# Patient Record
Sex: Female | Born: 1980 | Race: White | Hispanic: No | Marital: Married | Smoking: Never smoker
Health system: Southern US, Community
[De-identification: ages and names within clinical notes are randomized; demographics above are authoritative.]

## PROBLEM LIST (undated history)

## (undated) DIAGNOSIS — F419 Anxiety disorder, unspecified: Secondary | ICD-10-CM

## (undated) DIAGNOSIS — R519 Headache, unspecified: Secondary | ICD-10-CM

## (undated) DIAGNOSIS — R51 Headache: Secondary | ICD-10-CM

## (undated) DIAGNOSIS — Z86018 Personal history of other benign neoplasm: Principal | ICD-10-CM

## (undated) HISTORY — PX: CHOLECYSTECTOMY: SHX55

## (undated) HISTORY — DX: Personal history of other benign neoplasm: Z86.018

---

## 2009-08-19 ENCOUNTER — Observation Stay: Payer: Self-pay | Admitting: Surgery

## 2013-10-25 DIAGNOSIS — F419 Anxiety disorder, unspecified: Secondary | ICD-10-CM | POA: Insufficient documentation

## 2014-06-02 ENCOUNTER — Ambulatory Visit: Payer: Self-pay

## 2014-10-17 DIAGNOSIS — Z86018 Personal history of other benign neoplasm: Secondary | ICD-10-CM

## 2014-10-17 HISTORY — DX: Personal history of other benign neoplasm: Z86.018

## 2015-10-16 ENCOUNTER — Encounter
Admission: RE | Admit: 2015-10-16 | Discharge: 2015-10-16 | Disposition: A | Discharge status: Home / Self Care | Payer: Managed Care, Other (non HMO) | Source: Ambulatory Visit | Attending: Obstetrics and Gynecology | Admitting: Obstetrics and Gynecology

## 2015-10-16 DIAGNOSIS — Z01812 Encounter for preprocedural laboratory examination: Secondary | ICD-10-CM | POA: Diagnosis not present

## 2015-10-16 HISTORY — DX: Headache, unspecified: R51.9

## 2015-10-16 HISTORY — DX: Anxiety disorder, unspecified: F41.9

## 2015-10-16 HISTORY — DX: Headache: R51

## 2015-10-16 LAB — BASIC METABOLIC PANEL
ANION GAP: 3 — AB (ref 5–15)
BUN: 19 mg/dL (ref 6–20)
CHLORIDE: 104 mmol/L (ref 101–111)
CO2: 29 mmol/L (ref 22–32)
Calcium: 8.8 mg/dL — ABNORMAL LOW (ref 8.9–10.3)
Creatinine, Ser: 0.7 mg/dL (ref 0.44–1.00)
GFR calc non Af Amer: >60 mL/min (ref >60)
Glucose, Bld: 99 mg/dL (ref 65–99)
POTASSIUM: 3.7 mmol/L (ref 3.5–5.1)
SODIUM: 136 mmol/L (ref 135–145)

## 2015-10-16 LAB — CBC
HCT: 39.7 % (ref 35.0–47.0)
HEMOGLOBIN: 13.5 g/dL (ref 12.0–16.0)
MCH: 28.5 pg (ref 26.0–34.0)
MCHC: 34 g/dL (ref 32.0–36.0)
MCV: 83.8 fL (ref 80.0–100.0)
Platelets: 241 10*3/uL (ref 150–440)
RBC: 4.74 MIL/uL (ref 3.80–5.20)
RDW: 13.8 % (ref 11.5–14.5)
WBC: 9.4 10*3/uL (ref 3.6–11.0)

## 2015-10-16 LAB — ABO/RH: ABO/RH(D): O POS

## 2015-10-16 LAB — TYPE AND SCREEN
ABO/RH(D): O POS
Antibody Screen: NEGATIVE

## 2015-10-16 NOTE — H&P (Signed)
  Tracy Cherry is a 35 y.o. female here for Pre Op Consulting . Preop visit for sterilization, outside of the postpartum period.  The patient is interested in permanent sterilization. In her own words, " I have a 35yr old and I am done."  Past Medical History:  has a past medical history of Anxiety, unspecified.  Past Surgical History:  has a past surgical history that includes Cholecystectomy. Family History: family history includes Diabetes mellitus in her paternal grandmother; Lung cancer in her father; No Known Problems in her mother. Social History:  reports that she has never smoked. She does not have any smokeless tobacco history on file. She reports that she does not drink alcohol or use illicit drugs. OB/GYN History:  OB History    Gravida Para Term Preterm AB TAB SAB Ectopic Multiple Living   1 1 1       1       Allergies: has No Known Allergies. Medications:  Current Outpatient Prescriptions:  . desog-e.estradiol/e.estradiol biphasic (KARIVA) 0.15-0.02 mgx21 /0.01 mg x 5 tablet, Take 1 tablet by mouth once daily., Disp: 3 Package, Rfl: 4 . LORazepam (ATIVAN) 1 MG tablet, Take 1 tablet (1 mg total) by mouth once daily as needed for Anxiety. Patient to come back for appt per Mendel Ryder., Disp: 30 tablet, Rfl: 0  Review of Systems: No SOB, no palpitations or chest pain, no new lower extremity edema, no nausea or vomiting or bowel or bladder complaints. See HPI for gyn specific ROS.  Exam:     Visit Vitals  . BP 115/85  . Pulse 92  . Wt 75.2 kg (165 lb 12.8 oz)  . LMP 07/11/2015  . BMI 27.59 kg/m2    General: Patient is well-groomed, well-nourished, appears stated age in no acute distress  HEENT: head is atraumatic and normocephalic, trachea is midline, neck is supple with no palpable nodules  CV: Regular rhythm and normal heart rate, no murmur  Pulm: Clear to auscultation throughout lung fields with no wheezing, crackles, or rhonchi. No increased  work of breathing  Abdomen: soft , no mass, non-tender, no rebound tenderness, no hepatomegaly  Pelvic:  Deferred    Impression:   The primary encounter diagnosis was Preop examination. A diagnosis of Request for sterilization was also pertinent to this visit.    Plan:   Patient desires surgical sterilization. Patient has been counseled on alternate forms of contraception including hormonal forms, IUD's and barrier methods. She has been counseled on risks of surgical sterilization including bleeding, infection, pain, injury during procedure, risk of need for further procedures/surgeries due to injury or abnormalities at the time of surgery, thromboembolic events, exacerbation of ongoing medical conditions, risk of ectopic pregnancy, risk of failure of procedure to prevent pregnancy, medication reactions as well as the risk of anesthesia. Patient verbalizes understanding. Consent form signed. Preoperative and postoperative instructions provided. Written and verbal education provided. No barriers to learning.

## 2015-10-16 NOTE — Patient Instructions (Signed)
  Your procedure is scheduled [id number] April 3 , 2017. Report to Same Day Surgery. To find out your arrival time please call [phone] between 1PM - 3PM on Friday October 20, 2015 .  Remember: Instructions that are not followed completely may result in serious medical risk, up to and including death, or upon the discretion of your surgeon and anesthesiologist your surgery may need to be rescheduled.    _x___ 1. Do not eat food or drink liquids after midnight. No gum chewing or hard candies.     _x___ 2. No Alcohol for 24 hours before or after surgery.   ____ 3. Bring all medications with you on the day of surgery if instructed.    __x__ 4. Notify your doctor if there is any change in your medical condition     (cold, fever, infections).     Do not wear jewelry, make-up, hairpins, clips or nail polish.  Do not wear lotions, powders, or perfumes. You may wear deodorant.  Do not shave 48 hours prior to surgery. Men may shave face and neck.  Do not bring valuables to the hospital.    [facility] is not responsible for any belongings or valuables.               Contacts, dentures or bridgework may not be worn into surgery.  Leave your suitcase in the car. After surgery it may be brought to your room.  For patients admitted to the hospital, discharge time is determined by your treatment team.   Patients discharged the day of surgery will not be allowed to drive home.    Please read over the following fact sheets that you were given:   [facility] Preparing for Surgery  _x___ Take these medicines the morning of surgery with A SIP OF WATER:    1. LORazepam (ATIVAN) optional  ____ Fleet Enema (as directed)   _x___ Use CHG Soap as directed on instruction sheet  ____ Use inhalers on the day of surgery and bring to hospital day of surgery  ____ Stop metformin 2 days prior to surgery    ____ Take 1/2 of usual insulin dose the night before surgery and none on the morning of surgery.    ____ Stop Coumadin/Plavix/aspirin on does not apply.  __x__ Stop Anti-inflammatories such as Advil, Aleve, Ibuprofen, Motrin, Naproxen, Naprosyn, Goodies powders or aspirin products. OK to take Tylenol for pain.   ____ Stop supplements until after surgery.    ____ Bring C-Pap to the hospital.

## 2015-10-22 DIAGNOSIS — F419 Anxiety disorder, unspecified: Secondary | ICD-10-CM | POA: Diagnosis not present

## 2015-10-22 DIAGNOSIS — Z302 Encounter for sterilization: Secondary | ICD-10-CM | POA: Diagnosis present

## 2015-10-22 DIAGNOSIS — N809 Endometriosis, unspecified: Secondary | ICD-10-CM | POA: Diagnosis not present

## 2015-10-23 ENCOUNTER — Ambulatory Visit: Payer: Managed Care, Other (non HMO) | Admitting: Anesthesiology

## 2015-10-23 ENCOUNTER — Encounter: Payer: Self-pay | Admitting: *Deleted

## 2015-10-23 ENCOUNTER — Ambulatory Visit
Admission: RE | Admit: 2015-10-23 | Discharge: 2015-10-23 | Disposition: A | Discharge status: Home / Self Care | Payer: Managed Care, Other (non HMO) | Source: Ambulatory Visit | Attending: Obstetrics and Gynecology | Admitting: Obstetrics and Gynecology

## 2015-10-23 ENCOUNTER — Encounter
Admission: RE | Disposition: A | Discharge status: Home / Self Care | Payer: Self-pay | Source: Ambulatory Visit | Attending: Obstetrics and Gynecology

## 2015-10-23 DIAGNOSIS — Z302 Encounter for sterilization: Secondary | ICD-10-CM | POA: Insufficient documentation

## 2015-10-23 DIAGNOSIS — F419 Anxiety disorder, unspecified: Secondary | ICD-10-CM | POA: Insufficient documentation

## 2015-10-23 DIAGNOSIS — N809 Endometriosis, unspecified: Secondary | ICD-10-CM | POA: Insufficient documentation

## 2015-10-23 HISTORY — PX: LAPAROSCOPIC TUBAL LIGATION: SHX1937

## 2015-10-23 HISTORY — PX: BILATERAL SALPINGECTOMY: SHX5743

## 2015-10-23 LAB — POCT PREGNANCY, URINE: PREG TEST UR: NEGATIVE

## 2015-10-23 SURGERY — LIGATION, FALLOPIAN TUBE, LAPAROSCOPIC
Anesthesia: General | Site: Abdomen | Laterality: Bilateral | Wound class: Clean

## 2015-10-23 MED ORDER — ACETAMINOPHEN 10 MG/ML IV SOLN
INTRAVENOUS | Status: AC
  Filled 2015-10-23: qty 100

## 2015-10-23 MED ORDER — FENTANYL CITRATE (PF) 100 MCG/2ML IJ SOLN
25.0000 ug | INTRAMUSCULAR | Status: DC | PRN
  Administered 2015-10-23: 25 ug via INTRAVENOUS
  Administered 2015-10-23: 50 ug via INTRAVENOUS

## 2015-10-23 MED ORDER — LIDOCAINE HCL (CARDIAC) 20 MG/ML IV SOLN
INTRAVENOUS | Status: DC | PRN
  Administered 2015-10-23: 100 mg via INTRAVENOUS

## 2015-10-23 MED ORDER — BUPIVACAINE HCL 0.5 % IJ SOLN
INTRAMUSCULAR | Status: DC | PRN
Start: 2015-10-23 — End: 2015-10-23
  Administered 2015-10-23: 14 mL

## 2015-10-23 MED ORDER — FENTANYL CITRATE (PF) 100 MCG/2ML IJ SOLN
INTRAMUSCULAR | Status: AC
  Filled 2015-10-23: qty 2

## 2015-10-23 MED ORDER — BUPIVACAINE HCL (PF) 0.5 % IJ SOLN
INTRAMUSCULAR | Status: AC
  Filled 2015-10-23: qty 30

## 2015-10-23 MED ORDER — ACETAMINOPHEN 10 MG/ML IV SOLN
INTRAVENOUS | Status: DC | PRN
  Administered 2015-10-23: 1000 mg via INTRAVENOUS

## 2015-10-23 MED ORDER — FAMOTIDINE 20 MG PO TABS
20.0000 mg | ORAL_TABLET | Freq: Once | ORAL | Status: AC
  Administered 2015-10-23: 20 mg via ORAL

## 2015-10-23 MED ORDER — PROPOFOL 10 MG/ML IV BOLUS
INTRAVENOUS | Status: DC | PRN
  Administered 2015-10-23: 150 mg via INTRAVENOUS

## 2015-10-23 MED ORDER — KETOROLAC TROMETHAMINE 30 MG/ML IJ SOLN
INTRAMUSCULAR | Status: DC | PRN
  Administered 2015-10-23: 30 mg via INTRAVENOUS

## 2015-10-23 MED ORDER — FENTANYL CITRATE (PF) 100 MCG/2ML IJ SOLN
INTRAMUSCULAR | Status: DC | PRN
  Administered 2015-10-23 (×2): 50 ug via INTRAVENOUS

## 2015-10-23 MED ORDER — DEXAMETHASONE SODIUM PHOSPHATE 10 MG/ML IJ SOLN
INTRAMUSCULAR | Status: DC | PRN
  Administered 2015-10-23: 5 mg via INTRAVENOUS

## 2015-10-23 MED ORDER — ONDANSETRON HCL 4 MG/2ML IJ SOLN
INTRAMUSCULAR | Status: DC | PRN
  Administered 2015-10-23: 4 mg via INTRAVENOUS

## 2015-10-23 MED ORDER — IBUPROFEN 800 MG PO TABS: 800.0000 mg | ORAL_TABLET | Freq: Three times a day (TID) | ORAL | Status: AC | PRN

## 2015-10-23 MED ORDER — OXYCODONE HCL 5 MG PO TABS: 5.0000 mg | ORAL_TABLET | Freq: Once | ORAL | Status: DC | PRN

## 2015-10-23 MED ORDER — ROCURONIUM BROMIDE 100 MG/10ML IV SOLN
INTRAVENOUS | Status: DC | PRN
  Administered 2015-10-23: 30 mg via INTRAVENOUS
  Administered 2015-10-23: 10 mg via INTRAVENOUS

## 2015-10-23 MED ORDER — FAMOTIDINE 20 MG PO TABS
ORAL_TABLET | ORAL | Status: AC
  Filled 2015-10-23: qty 1

## 2015-10-23 MED ORDER — MIDAZOLAM HCL 2 MG/2ML IJ SOLN
INTRAMUSCULAR | Status: DC | PRN
  Administered 2015-10-23: 2 mg via INTRAVENOUS

## 2015-10-23 MED ORDER — SUGAMMADEX SODIUM 200 MG/2ML IV SOLN
INTRAVENOUS | Status: DC | PRN
  Administered 2015-10-23: 149.6 mg via INTRAVENOUS

## 2015-10-23 MED ORDER — LACTATED RINGERS IV SOLN
INTRAVENOUS | Status: DC
  Administered 2015-10-23 (×2): via INTRAVENOUS

## 2015-10-23 MED ORDER — OXYCODONE HCL 5 MG/5ML PO SOLN: 5.0000 mg | Freq: Once | ORAL | Status: DC | PRN

## 2015-10-23 MED ORDER — OXYCODONE-ACETAMINOPHEN 5-325 MG PO TABS
1.0000 | ORAL_TABLET | Freq: Four times a day (QID) | ORAL | Status: DC | PRN
Start: 2015-10-23 — End: 2021-08-15

## 2015-10-23 SURGICAL SUPPLY — 27 items
BLADE SURG SZ11 CARB STEEL (BLADE) ×3 IMPLANT
CATH ROBINSON RED A/P 16FR (CATHETERS) ×3 IMPLANT
CHLORAPREP W/TINT 26ML (MISCELLANEOUS) ×3 IMPLANT
CLOSURE WOUND 1/4X4 (GAUZE/BANDAGES/DRESSINGS) ×2
DRAPE UTILITY 15X26 TOWEL STRL (DRAPES) ×6 IMPLANT
GLOVE BIO SURGEON STRL SZ 6.5 (GLOVE) ×2 IMPLANT
GLOVE BIO SURGEONS STRL SZ 6.5 (GLOVE) ×1
GLOVE INDICATOR 7.0 STRL GRN (GLOVE) ×3 IMPLANT
GOWN STRL REUS W/ TWL LRG LVL3 (GOWN DISPOSABLE) ×2 IMPLANT
GOWN STRL REUS W/TWL LRG LVL3 (GOWN DISPOSABLE) ×4
KIT RM TURNOVER CYSTO AR (KITS) ×3 IMPLANT
LABEL OR SOLS (LABEL) ×3 IMPLANT
LIGASURE BLUNT 5MM 37CM (INSTRUMENTS) ×3 IMPLANT
LIQUID BAND (GAUZE/BANDAGES/DRESSINGS) ×3 IMPLANT
NS IRRIG 500ML POUR BTL (IV SOLUTION) ×3 IMPLANT
PACK GYN LAPAROSCOPIC (MISCELLANEOUS) ×3 IMPLANT
PAD OB MATERNITY 4.3X12.25 (PERSONAL CARE ITEMS) ×3 IMPLANT
PAD PREP 24X41 OB/GYN DISP (PERSONAL CARE ITEMS) ×3 IMPLANT
SLEEVE ENDOPATH XCEL 5M (ENDOMECHANICALS) ×9 IMPLANT
STRIP CLOSURE SKIN 1/4X4 (GAUZE/BANDAGES/DRESSINGS) ×4 IMPLANT
SUT MNCRL 4-0 (SUTURE) ×2
SUT MNCRL 4-0 27XMFL (SUTURE) ×1
SUT VIC AB 2-0 UR6 27 (SUTURE) ×3 IMPLANT
SUTURE MNCRL 4-0 27XMF (SUTURE) ×1 IMPLANT
TROCAR ENDO BLADELESS 11MM (ENDOMECHANICALS) ×3 IMPLANT
TROCAR XCEL NON-BLD 5MMX100MML (ENDOMECHANICALS) ×3 IMPLANT
TUBING INSUFFLATOR HI FLOW (MISCELLANEOUS) ×3 IMPLANT

## 2015-10-23 NOTE — Transfer of Care (Signed)
Immediate Anesthesia Transfer of Care Note  Patient: Tracy Cherry  Procedure(s) Performed: Procedure(s): LAPAROSCOPIC TUBAL LIGATION WITH PERIOTNEAL BIOPSY (Bilateral) BILATERAL SALPINGECTOMY (Bilateral)  Patient Location: PACU  Anesthesia Type:General  Level of Consciousness: awake, alert  and oriented  Airway & Oxygen Therapy: Patient Spontanous Breathing and Patient connected to face mask oxygen  Post-op Assessment: Report given to RN and Post -op Vital signs reviewed and stable  Post vital signs: stable  Last Vitals:  Filed Vitals:   10/23/15 0612 10/23/15 0902  BP: 130/65 115/63  Pulse: 85 91  Temp: 36.7 C 36.6 C  Resp: 16 13    Complications: No apparent anesthesia complications

## 2015-10-23 NOTE — Interval H&P Note (Signed)
History and Physical Interval Note:  10/23/2015 7:27 AM  Tracy Cherry  has presented today for surgery, with the diagnosis of desires sterilization  The various methods of treatment have been discussed with the patient and family. After consideration of risks, benefits and other options for treatment, the patient has consented to  Procedure(s): LAPAROSCOPIC TUBAL LIGATION (Bilateral) by salpingectomy as a surgical intervention .  The patient's history has been reviewed, patient examined, no change in status, stable for surgery.  I have reviewed the patient's chart and labs.  Questions were answered to the patient's satisfaction.     Benjaman Kindler

## 2015-10-23 NOTE — Anesthesia Postprocedure Evaluation (Signed)
Anesthesia Post Note  Patient: Tracy Cherry  Procedure(s) Performed: Procedure(s) (LRB): LAPAROSCOPIC TUBAL LIGATION WITH PERIOTNEAL BIOPSY (Bilateral) BILATERAL SALPINGECTOMY (Bilateral)  Patient location during evaluation: PACU Anesthesia Type: General Level of consciousness: awake and alert Pain management: pain level controlled Vital Signs Assessment: post-procedure vital signs reviewed and stable Respiratory status: spontaneous breathing, nonlabored ventilation, respiratory function stable and patient connected to nasal cannula oxygen Cardiovascular status: blood pressure returned to baseline and stable Postop Assessment: no signs of nausea or vomiting Anesthetic complications: no    Last Vitals:  Filed Vitals:   10/23/15 0612 10/23/15 0902  BP: 130/65 115/63  Pulse: 85 91  Temp: 36.7 C 36.6 C  Resp: 16 13    Last Pain:  Filed Vitals:   10/23/15 0945  PainSc: 1                  Precious Haws Seth Higginbotham

## 2015-10-23 NOTE — Anesthesia Preprocedure Evaluation (Signed)
Anesthesia Evaluation  Patient identified by MRN, date of birth, ID band Patient awake    Reviewed: Allergy & Precautions, H&P , NPO status , Patient's Chart, lab work & pertinent test results  History of Anesthesia Complications Negative for: history of anesthetic complications  Airway Mallampati: II  TM Distance: >3 FB Neck ROM: full    Dental no notable dental hx.    Pulmonary neg pulmonary ROS, neg shortness of breath,    Pulmonary exam normal breath sounds clear to auscultation       Cardiovascular Exercise Tolerance: Good (-) angina(-) Past MI and (-) DOE negative cardio ROS Normal cardiovascular exam Rhythm:regular Rate:Normal     Neuro/Psych  Headaches, PSYCHIATRIC DISORDERS Anxiety    GI/Hepatic negative GI ROS, Neg liver ROS, neg GERD  ,  Endo/Other  negative endocrine ROS  Renal/GU negative Renal ROS  negative genitourinary   Musculoskeletal   Abdominal   Peds  Hematology negative hematology ROS (+)   Anesthesia Other Findings Past Medical History:   Headache                                                     Anxiety                                                     Past Surgical History:   CHOLECYSTECTOMY                                                 Reproductive/Obstetrics negative OB ROS                             Anesthesia Physical Anesthesia Plan  ASA: III  Anesthesia Plan: General ETT   Post-op Pain Management:    Induction:   Airway Management Planned:   Additional Equipment:   Intra-op Plan:   Post-operative Plan:   Informed Consent: I have reviewed the patients History and Physical, chart, labs and discussed the procedure including the risks, benefits and alternatives for the proposed anesthesia with the patient or authorized representative who has indicated his/her understanding and acceptance.   Dental Advisory Given  Plan Discussed  with: Anesthesiologist, CRNA and Surgeon  Anesthesia Plan Comments:         Anesthesia Quick Evaluation

## 2015-10-23 NOTE — Discharge Instructions (Signed)
Laparoscopic Tubal Ligation Discharge Instructions  Laparoscopic tubal ligation and fulguration ties your fallopian tubes to prevent pregnancy in the future  RISKS AND COMPLICATIONS   Infection.  Bleeding.  Injury to surrounding organs.  Anesthetic side effects.  Failure of the procedure.  Risks of future ectopic pregnancy  PROCEDURE   You may be given a medicine to help you relax (sedative) before the procedure. You will be given a medicine to make you sleep (general anesthetic) during the procedure.  A tube will be put down your throat to help your breath while under general anesthesia.  Two small cuts (incisions) are made in the lower abdominal area and one incision is made near the belly button.  Your abdominal area will be inflated with a safe gas (carbon dioxide). This helps give the surgeon room to operate, visualize, and helps the surgeon avoid other organs.  A thin, lighted tube (laparoscope) with a camera attached is inserted into your abdomen through the incision near the belly button. Other small instruments may also be inserted through other abdominal incisions.  The fallopian tube is located and are removed.  After the fallopian tube is removed, the gas is released from the abdomen.  The incisions will be closed with stitches (sutures), and Dermabond. A bandage may be placed over the incisions.  AFTER THE PROCEDURE   You will also have some mild abdominal discomfort for 3-7 days. You will be given pain medicine to ease any discomfort.  As long as there are no problems, you may be allowed to go home. Someone will need to drive you home and be with you for at least 24 hours once home.  You may have some mild discomfort in the throat. This is from the tube placed in your throat while you were sleeping.  You may experience discomfort in the shoulder area from some trapped air between the liver and diaphragm. This sensation is normal and will slowly go away on its  own.  HOME CARE INSTRUCTIONS   Take all medicines as directed.  Only take over-the-counter or prescription medicines for pain, discomfort, or fever as directed by your caregiver.  Resume daily activities as directed.  Showers are preferred over baths.  You may resume sexual activities in 1 week or as directed.  Do not drive while taking narcotics.  SEEK MEDICAL CARE IF: .  There is increasing abdominal pain.  You feel lightheaded or faint.  You have the chills.  You have an oral temperature above 102 F (38.9 C).  There is pus-like (purulent) drainage from any of the wounds.  You are unable to pass gas or have a bowel movement.  You feel sick to your stomach (nauseous) or throw up (vomit).  MAKE SURE YOU:   Understand these instructions.  Will watch your condition.  Will get help right away if you are not doing well or get worse.  ExitCare Patient Information 2013 [place].    General Anesthesia, Adult General anesthesia is a sleep-like state of non-feeling produced by medicines (anesthetics). General anesthesia prevents you from being alert and feeling pain during a medical procedure. Your caregiver may recommend general anesthesia if your procedure: Is long. Is painful or uncomfortable. Would be frightening to see or hear. Requires you to be still. Affects your breathing. Causes significant blood loss. LET YOUR CAREGIVER KNOW ABOUT: Allergies to food or medicine. Medicines taken, including vitamins, herbs, eyedrops, over-the-counter medicines, and creams. Use of steroids (by mouth or creams). Previous problems with anesthetics  or numbing medicines, including problems experienced by relatives. History of bleeding problems or blood clots. Previous surgeries and types of anesthetics received. Possibility of pregnancy, if this applies. Use of cigarettes, alcohol, or illegal drugs. Any health condition(s), especially diabetes, sleep apnea, and high  blood pressure. RISKS AND COMPLICATIONS General anesthesia rarely causes complications. However, if complications do occur, they can be life threatening. Complications include: A lung infection. A stroke. A heart attack. Waking up during the procedure. When this occurs, the patient may be unable to move and communicate that he or she is awake. The patient may feel severe pain. Older adults and adults with serious medical problems are more likely to have complications than adults who are young and healthy. Some complications can be prevented by answering all of your caregiver's questions thoroughly and by following all pre-procedure instructions. It is important to tell your caregiver if any of the pre-procedure instructions, especially those related to diet, were not followed. Any food or liquid in the stomach can cause problems when you are under general anesthesia. BEFORE THE PROCEDURE Ask your caregiver if you will have to spend the night at the hospital. If you will not have to spend the night, arrange to have an adult drive you and stay with you for 24 hours. Follow your caregiver's instructions if you are taking dietary supplements or medicines. Your caregiver may tell you to stop taking them or to reduce your dosage. Do not smoke for as long as possible before your procedure. If possible, stop smoking 3-6 weeks before the procedure. Do not take new dietary supplements or medicines within 1 week of your procedure unless your caregiver approves them. Do not eat within 8 hours of your procedure or as directed by your caregiver. Drink only clear liquids, such as water, black coffee (without milk or cream), and fruit juices (without pulp). Do not drink within 3 hours of your procedure or as directed by your caregiver. You may brush your teeth on the morning of the procedure, but make sure to spit out the toothpaste and water when finished. PROCEDURE  You will receive anesthetics through a mask,  through an intravenous (IV) access tube, or through both. A doctor who specializes in anesthesia (anesthesiologist) or a nurse who specializes in anesthesia (nurse anesthetist) or both will stay with you throughout the procedure to make sure you remain unconscious. He or she will also watch your blood pressure, pulse, and oxygen levels to make sure that the anesthetics do not cause any problems. Once you are asleep, a breathing tube or mask may be used to help you breathe. AFTER THE PROCEDURE You will wake up after the procedure is complete. You may be in the room where the procedure was performed or in a recovery area. You may have a sore throat if a breathing tube was used. You may also feel: Dizzy. Weak. Drowsy. Confused. Nauseous. Cold. These are all normal responses and can be expected to last for up to 24 hours after the procedure is complete. A caregiver will tell you when you are ready to go home. This will usually be when you are fully awake and in stable condition.   This information is not intended to replace advice given to you by your health care provider. Make sure you discuss any questions you have with your health care provider.   Document Released: 10/15/2007 Document Revised: 07/29/2014 Document Reviewed: 11/06/2011 Elsevier Interactive Patient Education [employer].

## 2015-10-23 NOTE — Anesthesia Procedure Notes (Signed)
Procedure Name: Intubation Date/Time: 10/23/2015 7:42 AM Performed by: Aline Brochure Pre-anesthesia Checklist: Patient identified, Emergency Drugs available, Suction available and Patient being monitored Patient Re-evaluated:Patient Re-evaluated prior to inductionOxygen Delivery Method: Circle system utilized Preoxygenation: Pre-oxygenation with 100% oxygen Intubation Type: IV induction Ventilation: Mask ventilation without difficulty Laryngoscope Size: Mac and 3 Grade View: Grade I Tube type: Oral Tube size: 7.0 mm Number of attempts: 1 Airway Equipment and Method: Stylet Placement Confirmation: ETT inserted through vocal cords under direct vision,  positive ETCO2 and breath sounds checked- equal and bilateral Secured at: 21 cm Tube secured with: Tape Dental Injury: Teeth and Oropharynx as per pre-operative assessment

## 2015-10-23 NOTE — Op Note (Signed)
Tracy Cherry 10/23/2015  PREOPERATIVE DIAGNOSIS:  Undesired fertility  POSTOPERATIVE DIAGNOSIS:  Undesired fertility  PROCEDURE:  Laparoscopic Bilateral Tubal Sterilization using Bipolar Coagulation; peritoneal biopsy  ANESTHESIA:  General endotracheal  ANESTHESIOLOGIST: Andria Frames, MD Anesthesiologist: Andria Frames, MD CRNA: Aline Brochure, CRNA  SURGEON: Angelina Pih, MD  COMPLICATIONS:  None immediate.  ESTIMATED BLOOD LOSS:  Less than 20 ml.  FLUIDS: 1100 ml LR.  URINE OUTPUT:  300 ml of clear urine.  INDICATIONS: 35 y.o.   with undesired fertility, desires permanent sterilization. Other reversible forms of contraception were discussed with patient; she declines all other modalities.  Risks of procedure discussed with patient including permanence of method, bleeding, infection, injury to surrounding organs and need for additional procedures including laparotomy, risk of regret.  Failure risk of 0.5-1% with increased risk of ectopic gestation if pregnancy occurs was also discussed with patient.      FINDINGS:  Normal uterus, tubes, and ovaries. Powder burn implant on right pelvic sidewall cephalad to uterosacral ligaments. Omental adhesions to right anterior pelvis superior to the bladder that was left alone.  TECHNIQUE:  The patient was taken to the operating room where general anesthesia was obtained without difficulty.  She was then placed in the dorsal lithotomy position and prepared and draped in sterile fashion.  The bladder was cathed for an estimated amount of clear urine. After an adequate timeout was performed, a bivalved speculum was then placed in the patient's vagina, and the anterior lip of cervix grasped with the single-tooth tenaculum.  The uterine manipulator was then advanced into the uterus.  The speculum was removed from the vagina.   Attention was then turned to the patient's abdomen where a 5-mm skin incision was made in the umbilical  fold.  The Optiview51-mm trocar and sleeve were then advanced without difficulty with the laparoscope under direct visualization into the abdomen.  The abdomen was then insufflated with carbon dioxide gas and adequate pneumoperitoneum was obtained.  A survey of the patient's pelvis and abdomen revealed normal anatomy except for the implant, which was carefully sharply excised, being aware of the course of the ureter.     The fallopian tubes were observed and found to be normal in appearance. The courses of both ureters were visualized. A 23mm port was placed in the RLQ under direct visualization. A Ligasure device was then advanced through the operative port and used to coagulate and excise the full Fallopian tube, including the fimbriated ends.  Good blanching and coagulation was noted at the site of the application.  There was no bleeding noted in the mesosalpinx.  A similar process was carried out on the right fallopian tube allowing for bilateral tubal sterilization.   Good hemostasis was noted overall.  The instruments were then removed from the patient's abdomen and the fascial incision was repaired with 0 Vicryl, and the skin was closed with Dermabond.  The uterine manipulator and the tenaculum were removed from the vagina without complications. The patient tolerated the procedure well.  Sponge, lap, and needle counts were correct times two.  The patient was then taken to the recovery room awake, extubated and in stable condition.

## 2015-10-24 LAB — SURGICAL PATHOLOGY

## 2018-10-28 DIAGNOSIS — Z86018 Personal history of other benign neoplasm: Secondary | ICD-10-CM

## 2019-03-15 ENCOUNTER — Other Ambulatory Visit: Payer: Self-pay | Admitting: Student

## 2019-03-15 ENCOUNTER — Other Ambulatory Visit (HOSPITAL_COMMUNITY): Payer: Self-pay | Admitting: Student

## 2019-03-15 DIAGNOSIS — R1012 Left upper quadrant pain: Secondary | ICD-10-CM

## 2019-03-22 ENCOUNTER — Ambulatory Visit
Admission: RE | Admit: 2019-03-22 | Discharge: 2019-03-22 | Disposition: A | Discharge status: Home / Self Care | Payer: Managed Care, Other (non HMO) | Source: Ambulatory Visit | Attending: Student | Admitting: Student

## 2019-03-22 ENCOUNTER — Other Ambulatory Visit: Payer: Self-pay

## 2019-03-22 DIAGNOSIS — R1012 Left upper quadrant pain: Secondary | ICD-10-CM | POA: Diagnosis not present

## 2019-06-22 ENCOUNTER — Other Ambulatory Visit: Payer: Self-pay | Admitting: Obstetrics and Gynecology

## 2019-06-22 DIAGNOSIS — Z1231 Encounter for screening mammogram for malignant neoplasm of breast: Secondary | ICD-10-CM

## 2019-06-24 ENCOUNTER — Ambulatory Visit
Admission: RE | Admit: 2019-06-24 | Discharge: 2019-06-24 | Disposition: A | Discharge status: Home / Self Care | Payer: Managed Care, Other (non HMO) | Source: Ambulatory Visit | Attending: Obstetrics and Gynecology | Admitting: Obstetrics and Gynecology

## 2019-06-24 DIAGNOSIS — Z1231 Encounter for screening mammogram for malignant neoplasm of breast: Secondary | ICD-10-CM | POA: Diagnosis present

## 2020-07-06 ENCOUNTER — Other Ambulatory Visit: Payer: Self-pay | Admitting: Obstetrics and Gynecology

## 2020-07-06 DIAGNOSIS — Z1231 Encounter for screening mammogram for malignant neoplasm of breast: Secondary | ICD-10-CM

## 2020-08-15 ENCOUNTER — Ambulatory Visit
Admission: RE | Admit: 2020-08-15 | Discharge: 2020-08-15 | Disposition: A | Discharge status: Home / Self Care | Payer: Managed Care, Other (non HMO) | Source: Ambulatory Visit | Attending: Obstetrics and Gynecology | Admitting: Obstetrics and Gynecology

## 2020-08-15 ENCOUNTER — Other Ambulatory Visit: Payer: Self-pay

## 2020-08-15 DIAGNOSIS — Z1231 Encounter for screening mammogram for malignant neoplasm of breast: Secondary | ICD-10-CM | POA: Diagnosis present

## 2020-12-15 IMAGING — MG DIGITAL SCREENING BILAT W/ TOMO W/ CAD
8 series · 8 of 24 positions shown · non-contrast
Comparison: Previous exam(s).

CLINICAL DATA: Screening.

EXAM:
DIGITAL SCREENING BILATERAL MAMMOGRAM WITH TOMO AND CAD

[R CC synth-2D]
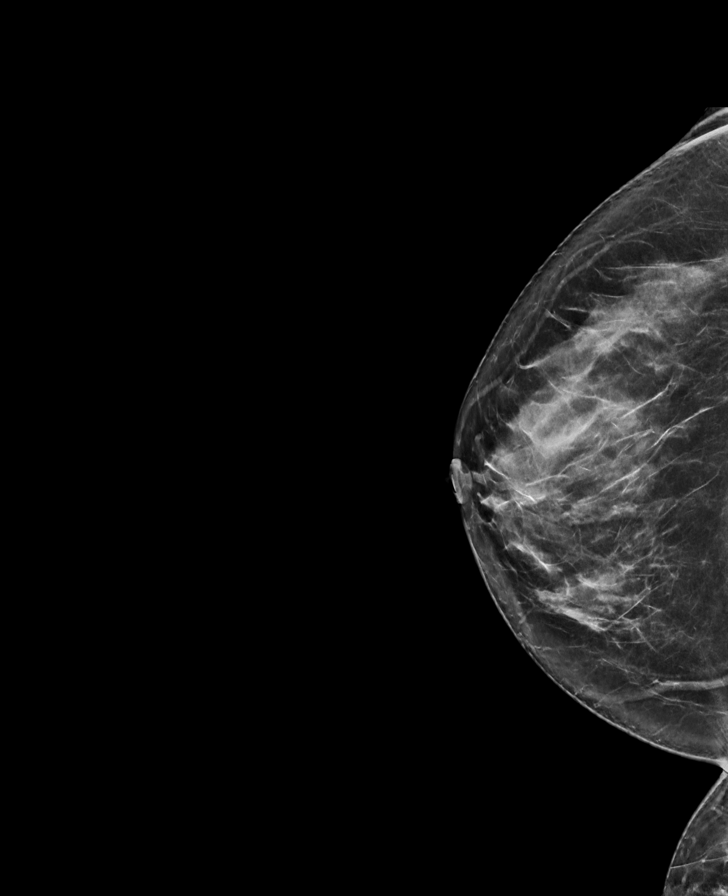

[L MLO synth-2D]
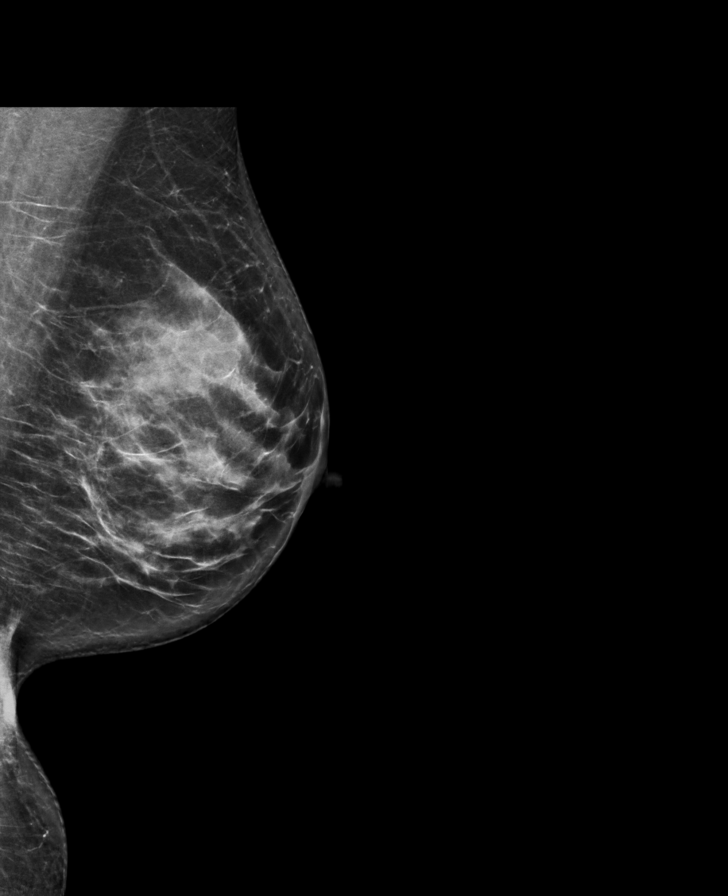

[L CC synth-2D]
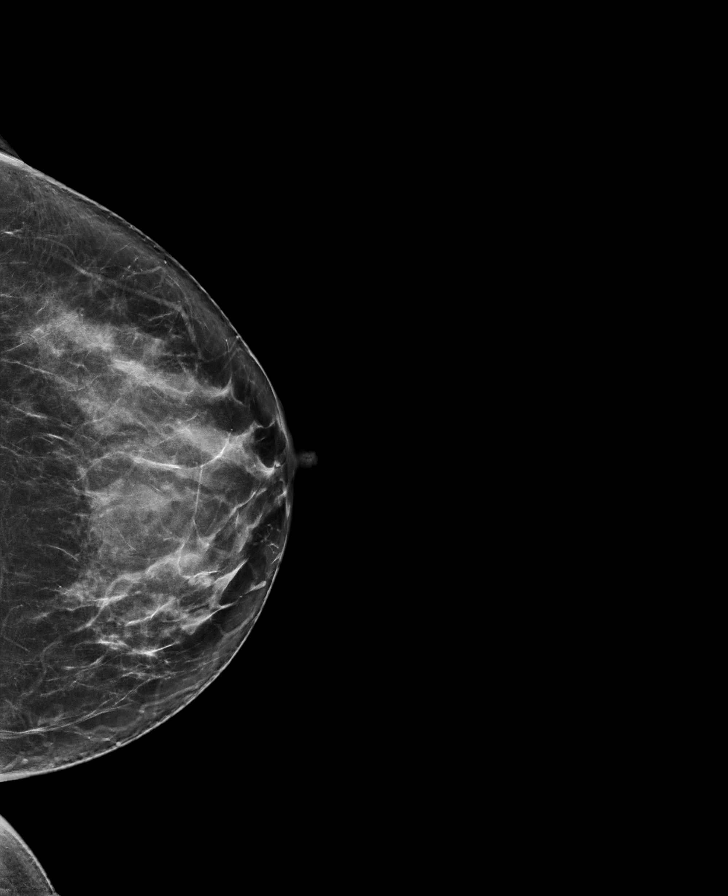

[R MLO synth-2D]
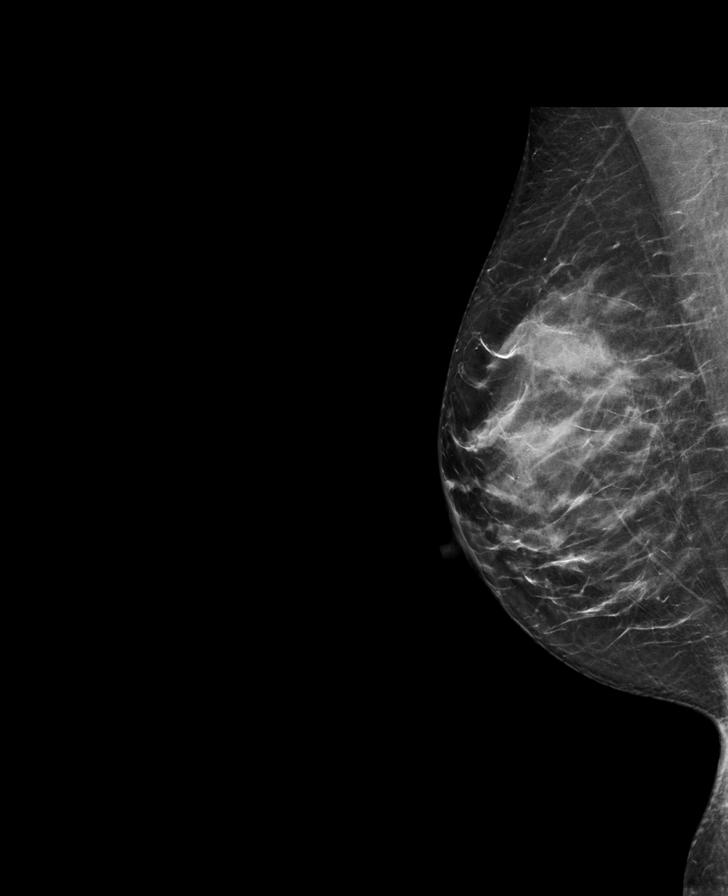

[L MLO tomo · tomo slice 39/78.0]
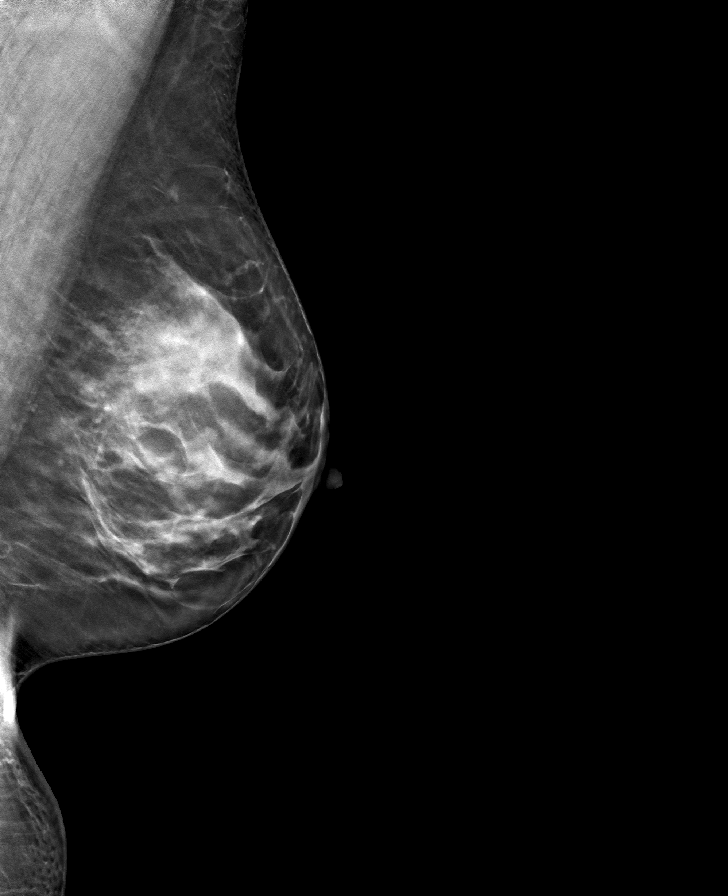

[R CC tomo · tomo slice 37/72.0]
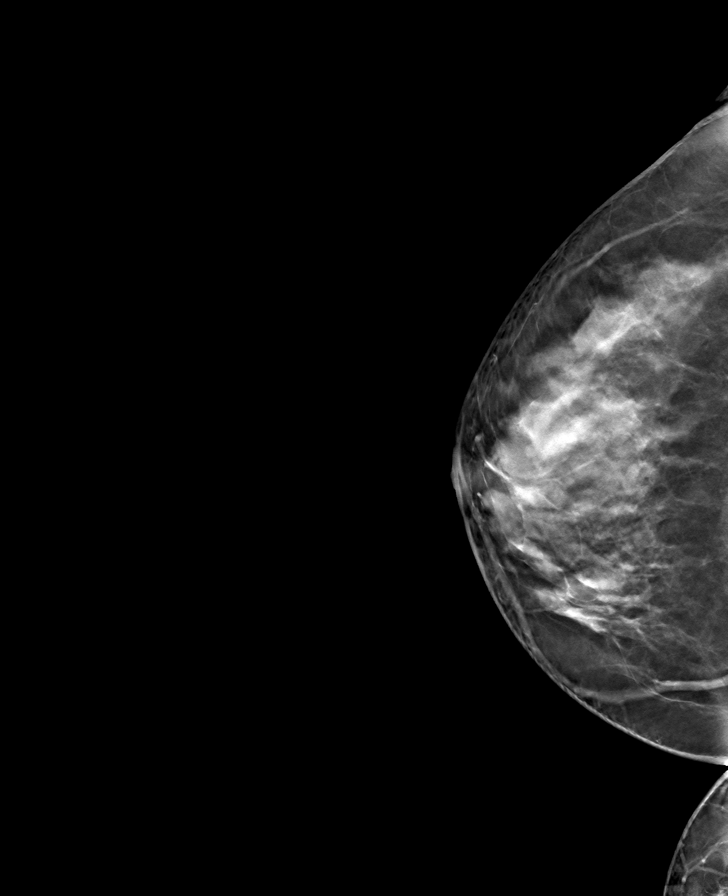

[R MLO tomo · tomo slice 37/74.0]
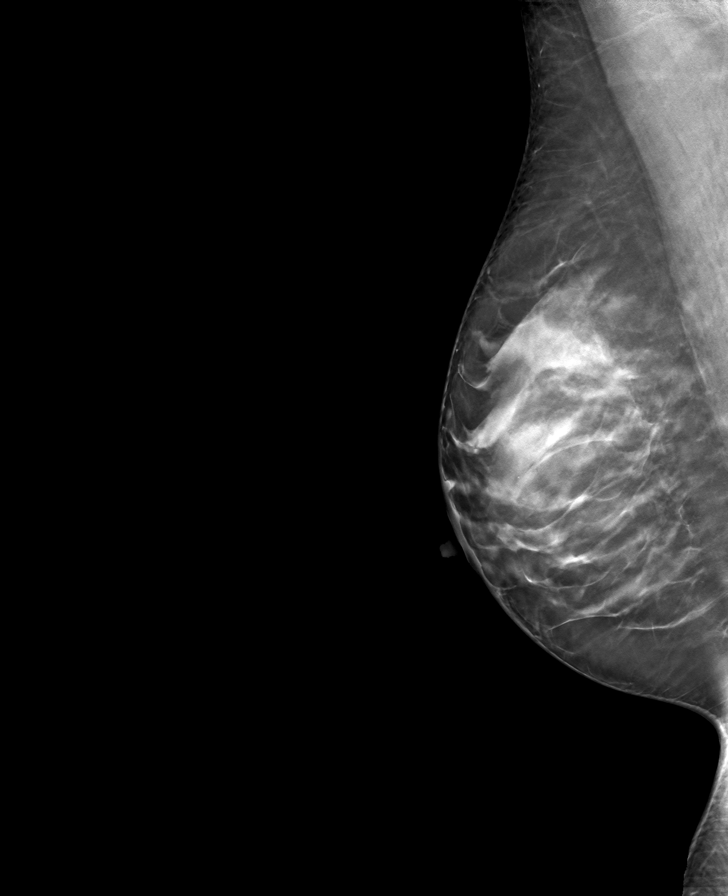

[L CC tomo · tomo slice 35/70.0]
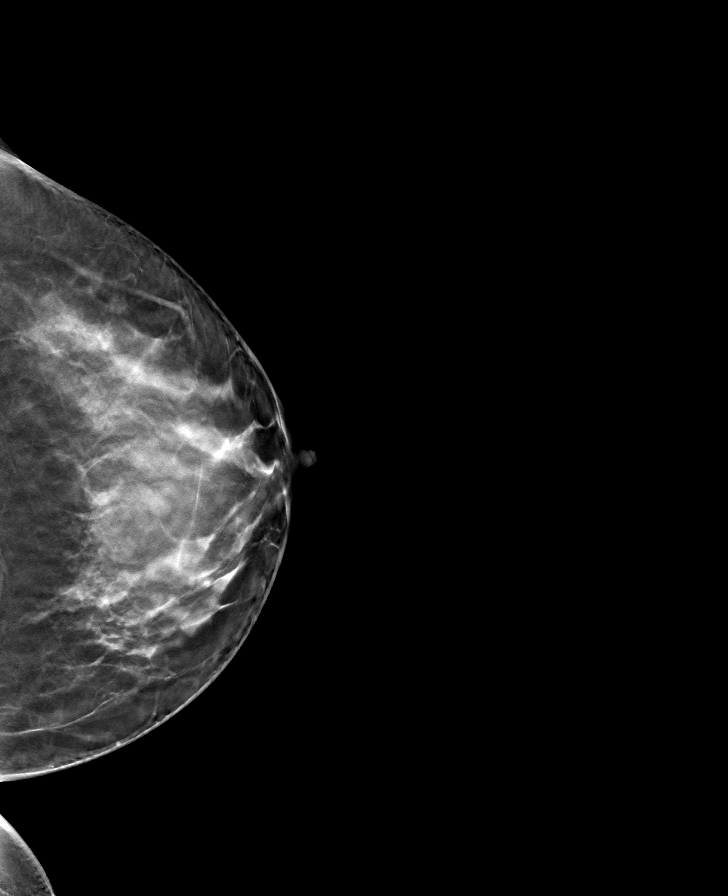

[8 of 24 positions shown; findings below may reference images not displayed]

ACR Breast Density Category c: The breast tissue is heterogeneously
dense, which may obscure small masses.
FINDINGS: There are no findings suspicious for malignancy. Images were
processed with CAD.
IMPRESSION: No mammographic evidence of malignancy. A result letter of this
screening mammogram will be mailed directly to the patient.

RECOMMENDATION:
Screening mammogram in one year. (Code:FT-U-LHB)

BI-RADS CATEGORY  1: Negative.

## 2021-07-10 ENCOUNTER — Other Ambulatory Visit: Payer: Self-pay | Admitting: Obstetrics and Gynecology

## 2021-07-12 ENCOUNTER — Other Ambulatory Visit: Payer: Self-pay | Admitting: Obstetrics and Gynecology

## 2021-07-12 DIAGNOSIS — N63 Unspecified lump in unspecified breast: Secondary | ICD-10-CM

## 2021-07-20 ENCOUNTER — Ambulatory Visit
Admission: RE | Admit: 2021-07-20 | Discharge: 2021-07-20 | Disposition: A | Discharge status: Home / Self Care | Payer: Managed Care, Other (non HMO) | Source: Ambulatory Visit | Attending: Obstetrics and Gynecology | Admitting: Obstetrics and Gynecology

## 2021-07-20 ENCOUNTER — Other Ambulatory Visit: Payer: Self-pay

## 2021-07-20 DIAGNOSIS — N63 Unspecified lump in unspecified breast: Secondary | ICD-10-CM

## 2021-08-15 ENCOUNTER — Ambulatory Visit: Payer: Managed Care, Other (non HMO) | Admitting: Dermatology

## 2021-08-15 ENCOUNTER — Encounter: Payer: Self-pay | Admitting: Dermatology

## 2021-08-15 ENCOUNTER — Other Ambulatory Visit: Payer: Self-pay

## 2021-08-15 DIAGNOSIS — L603 Nail dystrophy: Secondary | ICD-10-CM | POA: Diagnosis not present

## 2021-08-15 DIAGNOSIS — L814 Other melanin hyperpigmentation: Secondary | ICD-10-CM

## 2021-08-15 DIAGNOSIS — L821 Other seborrheic keratosis: Secondary | ICD-10-CM

## 2021-08-15 DIAGNOSIS — L578 Other skin changes due to chronic exposure to nonionizing radiation: Secondary | ICD-10-CM

## 2021-08-15 DIAGNOSIS — L720 Epidermal cyst: Secondary | ICD-10-CM

## 2021-08-15 DIAGNOSIS — Z1283 Encounter for screening for malignant neoplasm of skin: Secondary | ICD-10-CM

## 2021-08-15 DIAGNOSIS — D1800 Hemangioma unspecified site: Secondary | ICD-10-CM

## 2021-08-15 DIAGNOSIS — D229 Melanocytic nevi, unspecified: Secondary | ICD-10-CM

## 2021-08-15 NOTE — Progress Notes (Addendum)
New Patient Visit   Subjective  Tracy Cherry is a 41 y.o. female who presents for the following: Follow-up (Patient here today for 1 year tbse. Patient has a sebaceous cyst between breast. Patient denies any family or personal history of skin cancer. ).  Patient here for full body skin exam and skin cancer screening.  The following portions of the chart were reviewed this encounter and updated as appropriate:  Tobacco   Allergies   Meds   Problems   Med Hx   Surg Hx   Fam Hx       Review of Systems: No other skin or systemic complaints except as noted in HPI or Assessment and Plan.   Objective  Well appearing patient in no apparent distress; mood and affect are within normal limits.  A full examination was performed including scalp, head, eyes, ears, nose, lips, neck, chest, axillae, abdomen, back, buttocks, bilateral upper extremities, bilateral lower extremities, hands, feet, fingers, toes, fingernails, and toenails. All findings within normal limits unless otherwise noted below.  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 Pt defers at this time.   Patient using tea tree oil with some improvement. Will continue for now and let us know if she desires clipping to consider other therapy in future.      Epidermal inclusion cyst Chest - Medial (Center)  Benign-appearing. Exam most consistent with an epidermal inclusion cyst. Discussed that a cyst is a benign growth that can grow over time and sometimes get irritated or inflamed. Recommend  observation if it is not bothersome. Discussed option of surgical excision to remove it if it is growing, symptomatic, or other changes noted. Please call for new or changing lesions so they can be evaluated.    Lentigines - Scattered tan macules - Due to sun exposure - Benign-appearing, observe - Recommend daily broad spectrum sunscreen SPF 30+ to sun-exposed areas, reapply every 2 hours as needed. - Call for any changes  Seborrheic Keratoses - Stuck-on, waxy, tan-brown papules and/or plaques  - Benign-appearing - Discussed benign etiology and prognosis. - Observe - Call for any changes  Melanocytic Nevi - Tan-brown and/or pink-flesh-colored symmetric macules and papules - Benign appearing on exam today - Observation - Call clinic for new or changing moles - Recommend daily use of broad spectrum spf 30+ sunscreen to sun-exposed areas.   Hemangiomas - Red papules - Discussed benign nature - Observe - Call for any changes  Actinic Damage - Chronic condition, secondary to cumulative UV/sun exposure - diffuse scaly erythematous macules with underlying dyspigmentation - Recommend daily broad spectrum sunscreen SPF 30+ to sun-exposed areas, reapply every 2 hours as needed.  - Staying in the shade or wearing long sleeves, sun glasses (UVA+UVB protection) and wide brim hats (4-inch brim around the entire circumference of the hat) are also recommended for sun protection.  - Call for new or changing lesions.  Skin cancer screening performed today. Return for 1 year tbse . I, Ruthell Rummage, CMA, am acting as scribe for Forest Gleason, MD.  Documentation: I have reviewed  the above documentation for accuracy and completeness, and I agree with the above.  Forest Gleason, MD

## 2021-08-15 NOTE — Patient Instructions (Addendum)
Recommend taking Heliocare sun protection supplement daily in sunny weather for additional sun protection. For maximum protection on the sunniest days, you can take up to 2 capsules of regular Heliocare OR take 1 capsule of Heliocare Ultra. For prolonged exposure (such as a full day in the sun), you can repeat your dose of the supplement 4 hours after your first dose. Heliocare can be purchased at [employer], at some [facility] or at [email or web address].     Melanoma ABCDEs  Melanoma is the most dangerous type of skin cancer, and is the leading cause of death from skin disease.  You are more likely to develop melanoma if you: Have light-colored skin, light-colored eyes, or red or blond hair Spend a lot of time in the sun Tan regularly, either outdoors or in a tanning bed Have had blistering sunburns, especially during childhood Have a close family member who has had a melanoma Have atypical moles or large birthmarks  Early detection of melanoma is key since treatment is typically straightforward and cure rates are extremely high if we catch it early.   The first sign of melanoma is often a change in a mole or a new dark spot.  The ABCDE system is a way of remembering the signs of melanoma.  A for asymmetry:  The two halves do not match. B for border:  The edges of the growth are irregular. C for color:  A mixture of colors are present instead of an even brown color. D for diameter:  Melanomas are usually (but not always) greater than 40mm - the size of a pencil eraser. E for evolution:  The spot keeps changing in size, shape, and color.  Please check your skin once per month between visits. You can use a small mirror in front and a large mirror behind you to keep an eye on the back side or your body.   If you see any new or changing lesions before your next follow-up, please call to schedule a visit.  Please continue daily skin protection including broad spectrum sunscreen SPF  30+ to sun-exposed areas, reapplying every 2 hours as needed when you're outdoors.   Staying in the shade or wearing long sleeves, sun glasses (UVA+UVB protection) and wide brim hats (4-inch brim around the entire circumference of the hat) are also recommended for sun protection.    If You Need Anything After Your Visit  If you have any questions or concerns for your doctor, please call our main line at [phone] and press option 4 to reach your doctor's medical assistant. If no one answers, please leave a voicemail as directed and we will return your call as soon as possible. Messages left after 4 pm will be answered the following business day.   You may also send [place] a message via [place]. We typically respond to MyChart messages within 1-2 business days.  For prescription refills, please ask your pharmacy to contact our office. Our fax number is [phone].  If you have an urgent issue when the clinic is closed that cannot wait until the next business day, you can page your doctor at the number below.    Please note that while we do our best to be available for urgent issues outside of office hours, we are not available 24/7.   If you have an urgent issue and are unable to reach [place], you may choose to seek medical care at your doctor's office, retail clinic, urgent care center, or emergency room.  If you have a medical emergency, please immediately call 911 or go to the emergency department.  Pager Numbers  - Dr. Nehemiah Massed: [phone]  - Dr. Laurence Ferrari: [phone]  - Dr. Nicole Kindred: [phone]  In the event of inclement weather, please call our main line at [phone] for an update on the status of any delays or closures.  Dermatology Medication Tips: Please keep the boxes that topical medications come in in order to help keep track of the instructions about where and how to use these. Pharmacies typically print the medication instructions only on the boxes and not directly on the  medication tubes.   If your medication is too expensive, please contact our office at [phone] option 4 or send [place] a message through [place].   We are unable to tell what your co-pay for medications will be in advance as this is different depending on your insurance coverage. However, we may be able to find a substitute medication at lower cost or fill out paperwork to get insurance to cover a needed medication.   If a prior authorization is required to get your medication covered by your insurance company, please allow [place] 1-2 business days to complete this process.  Drug prices often vary depending on where the prescription is filled and some pharmacies may offer cheaper prices.  The website www.goodrx.com contains coupons for medications through different pharmacies. The prices here do not account for what the cost may be with help from insurance (it may be cheaper with your insurance), but the website can give you the price if you did not use any insurance.  - You can print the associated coupon and take it with your prescription to the pharmacy.  - You may also stop by our office during regular business hours and pick up a GoodRx coupon card.  - If you need your prescription sent electronically to a different pharmacy, notify our office through [facility] or by phone at [phone] option 4.     Si Usted Necesita Algo Despus de Su Visita  Tambin puede enviarnos un mensaje a travs de Pharmacist, community. Por lo general respondemos a los mensajes de MyChart en el transcurso de 1 a 2 das hbiles.  Para renovar recetas, por favor pida a su farmacia que se ponga en contacto con nuestra oficina. Harland Dingwall de fax es [place] [phone].  Si tiene un asunto urgente cuando la clnica est cerrada y que no puede esperar hasta el siguiente da hbil, puede llamar/localizar a su doctor(a) al nmero que aparece a continuacin.   Por favor, tenga en cuenta que aunque hacemos todo lo posible  para estar disponibles para asuntos urgentes fuera del horario de [place], no estamos disponibles las 24 horas del da, los 7 das de la [place].   Si tiene un problema urgente y no puede comunicarse con nosotros, puede optar por buscar atencin mdica  en el consultorio de su doctor(a), en una clnica privada, en un centro de atencin urgente o en una sala de emergencias.  Si tiene Engineering geologist, por favor llame inmediatamente al 911 o vaya a la sala de emergencias.  Nmeros de bper  - Dr. Nehemiah Massed: [phone]  - Dra. Moye: [phone]  - Dra. Nicole Kindred: [phone]  En caso de inclemencias del [place], por favor llame a Johnsie Kindred principal al [phone] para una actualizacin sobre el [place] de cualquier retraso o cierre.  Consejos para la medicacin en dermatologa: Por favor, guarde las cajas en las que vienen los medicamentos de  uso tpico para ayudarle a seguir las [employer] dnde y cmo usarlos. Las farmacias generalmente imprimen las instrucciones del medicamento slo en las cajas y no directamente en los tubos del [place].   Si su medicamento es muy caro, por favor, pngase en contacto con Zigmund Daniel llamando al [phone] y presione la opcin 4 o envenos un mensaje a travs de Pharmacist, community.   No podemos decirle cul ser su copago por los medicamentos por adelantado ya que esto es diferente dependiendo de la cobertura de su seguro. Sin embargo, es posible que podamos encontrar un medicamento sustituto a Electrical engineer un formulario para que el seguro cubra el medicamento que se considera necesario.   Si se requiere una autorizacin previa para que su compaa de seguros [place] su medicamento, por favor permtanos de 1 a 2 das hbiles para completar este proceso.  Los precios de los medicamentos varan con frecuencia dependiendo del Environmental consultant de dnde se surte la receta y alguna farmacias pueden ofrecer precios ms baratos.  El sitio web  www.goodrx.com tiene cupones para medicamentos de Airline pilot. Los precios aqu no tienen en cuenta lo que podra costar con la ayuda del seguro (puede ser ms barato con su seguro), pero el sitio web puede darle el precio si no utiliz Research scientist (physical sciences).  - Puede imprimir el cupn correspondiente y llevarlo con su receta a la farmacia.  - Tambin puede pasar por nuestra oficina durante el horario de atencin regular y Charity fundraiser una tarjeta de cupones de GoodRx.  - Si necesita que su receta se enve electrnicamente a una farmacia diferente, informe a nuestra oficina a travs de MyChart de [place] o por telfono llamando al [phone] y presione la opcin 4.

## 2021-08-20 NOTE — Addendum Note (Signed)
Addended by: Alfonso Patten on: 08/20/2021 09:17 PM   Modules accepted: Level of Service

## 2022-08-14 ENCOUNTER — Encounter: Payer: Self-pay | Admitting: Dermatology

## 2022-08-14 ENCOUNTER — Ambulatory Visit: Payer: Managed Care, Other (non HMO) | Admitting: Dermatology

## 2022-08-14 DIAGNOSIS — L720 Epidermal cyst: Secondary | ICD-10-CM

## 2022-08-14 DIAGNOSIS — D485 Neoplasm of uncertain behavior of skin: Secondary | ICD-10-CM

## 2022-08-14 NOTE — Progress Notes (Signed)
   Follow-Up Visit   Subjective  Tracy Cherry is a 42 y.o. female who presents for the following: Procedure (Patient here for excision of cyst at chest.).  The following portions of the chart were reviewed this encounter and updated as appropriate:   Tobacco  Allergies  Meds  Problems  Med Hx  Surg Hx  Fam Hx      Review of Systems:  No other skin or systemic complaints except as noted in HPI or Assessment and Plan.  Objective  Well appearing patient in no apparent distress; mood and affect are within normal limits.  A focused examination was performed including chest. Relevant physical exam findings are noted in the Assessment and Plan.  mid chest 1.1 cm subcutaneous nodule    Assessment & Plan  Neoplasm of uncertain behavior of skin mid chest  Skin excision  Lesion length (cm):  1.4 Lesion width (cm):  1.1 Total excision diameter (cm):  1.4 Informed consent: discussed and consent obtained   Timeout: patient name, date of birth, surgical site, and procedure verified   Procedure prep:  Patient was prepped and draped in usual sterile fashion Prep type:  Chlorhexidine Anesthesia: the lesion was anesthetized in a standard fashion   Anesthetic:  1% lidocaine w/ epinephrine 1-100,000 buffered w/ 8.4% NaHCO3 (6 cc lido w/epi) Instrument used comment:  15c Hemostasis achieved with: pressure and electrodesiccation    Skin repair Complexity:  Intermediate Final length (cm):  1.8 Informed consent: discussed and consent obtained   Timeout: patient name, date of birth, surgical site, and procedure verified   Procedure prep:  Patient was prepped and draped in usual sterile fashion Prep type:  Chlorhexidine Anesthesia: the lesion was anesthetized in a standard fashion   Anesthetic:  1% lidocaine w/ epinephrine 1-100,000 local infiltration Reason for type of repair: reduce tension to allow closure, reduce the risk of dehiscence, infection, and necrosis, preserve normal  anatomy and preserve normal anatomical and functional relationships   Undermining: edges undermined   Subcutaneous layers (deep stitches):  Suture size:  3-0 Suture type: Vicryl (polyglactin 910)   Fine/surface layer approximation (top stitches):  Suture size:  4-0 Suture type: nylon   Suture removal (days):  7 Hemostasis achieved with: suture, pressure and electrodesiccation Outcome: patient tolerated procedure well with no complications   Post-procedure details: wound care instructions given   Additional details:  Mupirocin and a pressure dressing applied  Specimen 1 - Surgical pathology Differential Diagnosis: Cyst vs Other  Check Margins: No 1.1 cm subcutaneous nodule   Return in about 1 week (around 08/21/2022) for TBSE, Suture Removal.  Graciella Belton, RMA, am acting as scribe for Forest Gleason, MD .  Documentation: I have reviewed the above documentation for accuracy and completeness, and I agree with the above.  Forest Gleason, MD

## 2022-08-14 NOTE — Patient Instructions (Signed)
Wound Care Instructions for After Surgery  On the day following your surgery, you should begin doing daily dressing changes until your sutures are removed: Remove the bandage. Cleanse the wound gently with soap and water.  Make sure you then dry the skin surrounding the wound completely or the tape will not stick to the skin. Do not use cotton balls on the wound. After the wound is clean and dry, apply the ointment (either prescription antibiotic prescribed by your doctor or plain Vaseline if nothing was prescribed) gently with a Q-tip. If you are using a bandaid to cover: Apply a bandaid large enough to cover the entire wound. If you do not have a bandaid large enough to cover the wound OR if you are sensitive to bandaid adhesive: Cut a non-stick pad (such as Telfa) to fit the size of the wound.  Cover the wound with the non-stick pad. If the wound is draining, you may want to add a small amount of gauze on top of the non-stick pad for a little added compression to the area. Use tape to seal the area completely.  For the next 1-2 weeks: Be sure to keep the wound moist with ointment 24/7 to ensure best healing. If you are unable to cover the wound with a bandage to hold the ointment in place, you may need to reapply the ointment several times a day. Do not bend over or lift heavy items to reduce the chance of elevated blood pressure to the wound. Do not participate in particularly strenuous activities.  Below is a list of dressing supplies you might need.  Cotton-tipped applicators - Q-tips Gauze pads (2x2 and/or 4x4) - All-Purpose Sponges New and clean tube of petroleum jelly (Vaseline) OR prescription antibiotic ointment if prescribed Either a bandaid large enough to cover the entire wound OR non-stick dressing material (Telfa) and Tape (Paper or Hypafix)  FOR ADULT SURGERY PATIENTS: If you need something for pain relief, you may take 1 extra strength Tylenol (acetaminophen) and 2  ibuprofen (200 mg) together every 4 hours as needed. (Do not take these medications if you are allergic to them or if you know you cannot take them for any other reason). Typically you may only need pain medication for 1-3 days.   Comments on the Post-Operative Period Slight swelling and redness often appear around the wound. This is normal and will disappear within several days following the surgery. The healing wound will drain a brownish-red-yellow discharge during healing. This is a normal phase of wound healing. As the wound begins to heal, the drainage may increase in amount. Again, this drainage is normal. Notify us if the drainage becomes persistently bloody, excessively swollen, or intensely painful or develops a foul odor or red streaks.  The healing wound will also typically be itchy. This is normal. If you have severe or persistent pain, Notify us if the discomfort is severe or persistent. Avoid alcoholic beverages when taking pain medicine.  In Case of Wound Hemorrhage A wound hemorrhage is when the bandage suddenly becomes soaked with bright red blood and flows profusely. If this happens, sit down or lie down with your head elevated. If the wound has a dressing on it, do not remove the dressing. Apply pressure to the existing gauze. If the wound is not covered, use a gauze pad to apply pressure and continue applying the pressure for 20 minutes without peeking. DO NOT COVER THE WOUND WITH A LARGE TOWEL OR WASH CLOTH. Release your hand from the   wound site but do not remove the dressing. If the bleeding has stopped, gently clean around the wound. Leave the dressing in place for 24 hours if possible. This wait time allows the blood vessels to close off so that you do not spark a new round of bleeding by disrupting the newly clotted blood vessels with an immediate dressing change. If the bleeding does not subside, continue to hold pressure for 40 minutes. If bleeding continues, page your  physician, contact an After Hours clinic or go to the Emergency Room.  Due to recent changes in healthcare laws, you may see results of your pathology and/or laboratory studies on MyChart before the doctors have had a chance to review them. We understand that in some cases there may be results that are confusing or concerning to you. Please understand that not all results are received at the same time and often the doctors may need to interpret multiple results in order to provide you with the best plan of care or course of treatment. Therefore, we ask that you please give us 2 business days to thoroughly review all your results before contacting the office for clarification. Should we see a critical lab result, you will be contacted sooner.   If You Need Anything After Your Visit  If you have any questions or concerns for your doctor, please call our main line at [phone] and press option 4 to reach your doctor's medical assistant. If no one answers, please leave a voicemail as directed and we will return your call as soon as possible. Messages left after 4 pm will be answered the following business day.   You may also send us a message via MyChart. We typically respond to MyChart messages within 1-2 business days.  For prescription refills, please ask your pharmacy to contact our office. Our fax number is [phone].  If you have an urgent issue when the clinic is closed that cannot wait until the next business day, you can page your doctor at the number below.    Please note that while we do our best to be available for urgent issues outside of office hours, we are not available 24/7.   If you have an urgent issue and are unable to reach us, you may choose to seek medical care at your doctor's office, retail clinic, urgent care center, or emergency room.  If you have a medical emergency, please immediately call 911 or go to the emergency department.  Pager Numbers  - Dr. Kowalski:  [phone]  - Dr. Moye: [phone]  - Dr. Stewart: [phone]  In the event of inclement weather, please call our main line at [phone] for an update on the status of any delays or closures.  Dermatology Medication Tips: Please keep the boxes that topical medications come in in order to help keep track of the instructions about where and how to use these. Pharmacies typically print the medication instructions only on the boxes and not directly on the medication tubes.   If your medication is too expensive, please contact our office at [phone] option 4 or send us a message through MyChart.   We are unable to tell what your co-pay for medications will be in advance as this is different depending on your insurance coverage. However, we may be able to find a substitute medication at lower cost or fill out paperwork to get insurance to cover a needed medication.   If a prior authorization is required to get your medication covered by   your insurance company, please allow us 1-2 business days to complete this process.  Drug prices often vary depending on where the prescription is filled and some pharmacies may offer cheaper prices.  The website www.goodrx.com contains coupons for medications through different pharmacies. The prices here do not account for what the cost may be with help from insurance (it may be cheaper with your insurance), but the website can give you the price if you did not use any insurance.  - You can print the associated coupon and take it with your prescription to the pharmacy.  - You may also stop by our office during regular business hours and pick up a GoodRx coupon card.  - If you need your prescription sent electronically to a different pharmacy, notify our office through [facility] MyChart or by phone at [phone] option 4.     Si Usted Necesita Algo Despus de Su Visita  Tambin puede enviarnos un mensaje a travs de MyChart. Por lo general  respondemos a los mensajes de MyChart en el transcurso de 1 a 2 das hbiles.  Para renovar recetas, por favor pida a su farmacia que se ponga en contacto con nuestra oficina. Nuestro nmero de fax es el [phone].  Si tiene un asunto urgente cuando la clnica est cerrada y que no puede esperar hasta el siguiente da hbil, puede llamar/localizar a su doctor(a) al nmero que aparece a continuacin.   Por favor, tenga en cuenta que aunque hacemos todo lo posible para estar disponibles para asuntos urgentes fuera del horario de oficina, no estamos disponibles las 24 horas del da, los 7 das de la semana.   Si tiene un problema urgente y no puede comunicarse con nosotros, puede optar por buscar atencin mdica  en el consultorio de su doctor(a), en una clnica privada, en un centro de atencin urgente o en una sala de emergencias.  Si tiene una emergencia mdica, por favor llame inmediatamente al 911 o vaya a la sala de emergencias.  Nmeros de bper  - Dr. Kowalski: [phone]  - Dra. Moye: [phone]  - Dra. Stewart: [phone]  En caso de inclemencias del tiempo, por favor llame a nuestra lnea principal al [phone] para una actualizacin sobre el estado de cualquier retraso o cierre.  Consejos para la medicacin en dermatologa: Por favor, guarde las cajas en las que vienen los medicamentos de uso tpico para ayudarle a seguir las instrucciones sobre dnde y cmo usarlos. Las farmacias generalmente imprimen las instrucciones del medicamento slo en las cajas y no directamente en los tubos del medicamento.   Si su medicamento es muy caro, por favor, pngase en contacto con nuestra oficina llamando al [phone] y presione la opcin 4 o envenos un mensaje a travs de MyChart.   No podemos decirle cul ser su copago por los medicamentos por adelantado ya que esto es diferente dependiendo de la cobertura de su seguro. Sin embargo, es posible que podamos encontrar un  medicamento sustituto a menor costo o llenar un formulario para que el seguro cubra el medicamento que se considera necesario.   Si se requiere una autorizacin previa para que su compaa de seguros cubra su medicamento, por favor permtanos de 1 a 2 das hbiles para completar este proceso.  Los precios de los medicamentos varan con frecuencia dependiendo del lugar de dnde se surte la receta y alguna farmacias pueden ofrecer precios ms baratos.  El sitio web www.goodrx.com tiene cupones para medicamentos de diferentes farmacias. Los precios aqu no tienen   en cuenta lo que podra costar con la ayuda del seguro (puede ser ms barato con su seguro), pero el sitio web puede darle el precio si no utiliz ningn seguro.  - Puede imprimir el cupn correspondiente y llevarlo con su receta a la farmacia.  - Tambin puede pasar por nuestra oficina durante el horario de atencin regular y recoger una tarjeta de cupones de GoodRx.  - Si necesita que su receta se enve electrnicamente a una farmacia diferente, informe a nuestra oficina a travs de MyChart de [place] o por telfono llamando al [phone] y presione la opcin 4.  

## 2022-08-15 ENCOUNTER — Telehealth: Payer: Self-pay

## 2022-08-15 NOTE — Telephone Encounter (Signed)
Patient doing fine following yesterday's surgery, JS

## 2022-08-21 ENCOUNTER — Encounter: Payer: Self-pay | Admitting: Dermatology

## 2022-08-21 ENCOUNTER — Ambulatory Visit: Payer: Managed Care, Other (non HMO) | Admitting: Dermatology

## 2022-08-21 ENCOUNTER — Other Ambulatory Visit: Payer: Self-pay | Admitting: Dermatology

## 2022-08-21 DIAGNOSIS — E569 Vitamin deficiency, unspecified: Secondary | ICD-10-CM | POA: Diagnosis not present

## 2022-08-21 DIAGNOSIS — L609 Nail disorder, unspecified: Secondary | ICD-10-CM

## 2022-08-21 DIAGNOSIS — L309 Dermatitis, unspecified: Secondary | ICD-10-CM | POA: Diagnosis not present

## 2022-08-21 DIAGNOSIS — L659 Nonscarring hair loss, unspecified: Secondary | ICD-10-CM | POA: Diagnosis not present

## 2022-08-21 MED ORDER — MINOXIDIL 2.5 MG PO TABS: 1.2500 mg | ORAL_TABLET | Freq: Every day | ORAL | 2 refills | Status: DC

## 2022-08-21 MED ORDER — KETOCONAZOLE 2 % EX CREA: 1.0000 | TOPICAL_CREAM | Freq: Two times a day (BID) | CUTANEOUS | 0 refills | Status: AC

## 2022-08-21 NOTE — Progress Notes (Signed)
Follow-Up Visit   Subjective  Tracy Cherry is a 42 y.o. female who presents for the following: FBSE (Hx dysplastic nevus) and Suture / Staple Removal.  The patient presents for Total-Body Skin Exam (TBSE) for skin cancer screening and mole check.  The patient has spots, moles and lesions to be evaluated, some may be new or changing and the patient has concerns that these could be cancer.  The following portions of the chart were reviewed this encounter and updated as appropriate:   Tobacco  Allergies  Meds  Problems  Med Hx  Surg Hx  Fam Hx      Review of Systems:  No other skin or systemic complaints except as noted in HPI or Assessment and Plan.  Objective  Well appearing patient in no apparent distress; mood and affect are within normal limits.  A full examination was performed including scalp, head, eyes, ears, nose, lips, neck, chest, axillae, abdomen, back, buttocks, bilateral upper extremities, bilateral lower extremities, hands, feet, fingers, toes, fingernails, and toenails. All findings within normal limits unless otherwise noted below.  left great toe Nail dystrophy  Scalp Diffuse thinning of hair, positive hair pull test. Some increased miniaturization at frontal scalp  Left Inframammary Fold Pink papules coalescing to plaques    Assessment & Plan  Nail problem left great toe  Trauma vs fungal Defer further evaluation or treatment at this time  Alopecia Scalp  C/w androgenetic alopecia with telogen effluvium  Patient scheduled for physical next month. Patient has had tubal ligation.   Recommend checking Vitamin D, Ferritin and TSH with reflex.  Discussed OTC topical minoxidil, low dose oral minoxidil, spironolactone and prescription topicals  Doses of minoxidil for hair loss are considered 'low dose'. This is because the doses used for hair loss are much lower than the doses which are used for conditions such as high blood pressure  (hypertension). The doses used for hypertension are 10-'40mg'$  per day.  Side effects are uncommon at the low doses (up to 2.5 mg/day) used to treat hair loss. Potential side effects, more commonly seen at higher doses, include: Increase in hair growth (hypertrichosis) elsewhere on face and body Temporary hair shedding upon starting medication which may last up to 4 weeks Ankle swelling, fluid retention, rapid weight gain more than 5 pounds Low blood pressure and feeling lightheaded or dizzy when standing up quickly Fast or irregular heartbeat Headaches  Start minoxidil 2.5 mg taking 1/2 tablet once daily.   minoxidil (LONITEN) 2.5 MG tablet - Scalp Take 0.5 tablets (1.25 mg total) by mouth daily.  Related Procedures Ferritin TSH Rfx on Abnormal to Free T4  Dermatitis Left Inframammary Fold  C/w irritant dermatitis from adhesive but some concern for candidal superinfection  Start ketoconazole 2% cream twice daily followed by [facility] 0.1% cream for up to 2 weeks. Avoid applying to face, groin, and axilla. Use as directed. Long-term use can cause thinning of the skin.  Topical steroids (such as triamcinolone, fluocinolone, fluocinonide, mometasone, clobetasol, halobetasol, betamethasone, hydrocortisone) can cause thinning and lightening of the skin if they are used for too long in the same area. Your physician has selected the right strength medicine for your problem and area affected on the body. Please use your medication only as directed by your physician to prevent side effects.   To fold area, can follow ketoconazole cream with hydrocortisone 2.5% cream twice a day for up to 2 weeks   ketoconazole (NIZORAL) 2 % cream - Left Inframammary Fold  Apply 1 Application topically 2 (two) times daily.  Vitamin deficiency  Related Procedures Vitamin D, 25-Hydroxy, Total   History of Dysplastic Nevi - No evidence of recurrence today - Recommend regular full body skin exams - Recommend daily  broad spectrum sunscreen SPF 30+ to sun-exposed areas, reapply every 2 hours as needed.  - Call if any new or changing lesions are noted between office visits  Lentigines - Scattered tan macules - Due to sun exposure - Benign-appearing, observe - Recommend daily broad spectrum sunscreen SPF 30+ to sun-exposed areas, reapply every 2 hours as needed. - Call for any changes  Seborrheic Keratoses - Stuck-on, waxy, tan-brown papules and/or plaques  - Benign-appearing - Discussed benign etiology and prognosis. - Observe - Call for any changes  Melanocytic Nevi - Tan-brown and/or pink-flesh-colored symmetric macules and papules - Benign appearing on exam today - Observation - Call clinic for new or changing moles - Recommend daily use of broad spectrum spf 30+ sunscreen to sun-exposed areas.   Hemangiomas - Red papules - Discussed benign nature - Observe - Call for any changes  Actinic Damage - Chronic condition, secondary to cumulative UV/sun exposure - diffuse scaly erythematous macules with underlying dyspigmentation - Recommend daily broad spectrum sunscreen SPF 30+ to sun-exposed areas, reapply every 2 hours as needed.  - Staying in the shade or wearing long sleeves, sun glasses (UVA+UVB protection) and wide brim hats (4-inch brim around the entire circumference of the hat) are also recommended for sun protection.  - Call for new or changing lesions.  Skin cancer screening performed today.  Encounter for Removal of Sutures - Incision site at the mid chest is clean, dry and intact - Wound cleansed, sutures removed, wound cleansed and steri strips applied.  - Discussed pathology results showing benign cyst  - Patient advised to keep steri-strips dry until they fall off. - Scars remodel for a full year. - Once steri-strips fall off, patient can apply over-the-counter silicone scar cream each night to help with scar remodeling if desired. - Patient advised to call with any  concerns or if they notice any new or changing lesions.  Return in about 1 year (around 08/22/2023) for TBSE, Hx Dysplastic Nevi.  Graciella Belton, RMA, am acting as scribe for Forest Gleason, MD .  Documentation: I have reviewed the above documentation for accuracy and completeness, and I agree with the above.  Forest Gleason, MD

## 2022-08-21 NOTE — Patient Instructions (Addendum)
Start ketoconazole 2% cream twice daily followed by [facility] 0.1% cream for up to 2 weeks. Avoid applying to face, groin, and axilla. Use as directed. Long-term use can cause thinning of the skin.  Topical steroids (such as triamcinolone, fluocinolone, fluocinonide, mometasone, clobetasol, halobetasol, betamethasone, hydrocortisone) can cause thinning and lightening of the skin if they are used for too long in the same area. Your physician has selected the right strength medicine for your problem and area affected on the body. Please use your medication only as directed by your physician to prevent side effects.   Telogen Effluvium -  Start minoxidil 2.5 mg taking 1/2 tablet once daily.   Doses of minoxidil for hair loss are considered 'low dose'. This is because the doses used for hair loss are much lower than the doses which are used for conditions such as high blood pressure (hypertension). The doses used for hypertension are 10-'40mg'$  per day.  Side effects are uncommon at the low doses (up to 2.5 mg/day) used to treat hair loss. Potential side effects, more commonly seen at higher doses, include: Increase in hair growth (hypertrichosis) elsewhere on face and body Temporary hair shedding upon starting medication which may last up to 4 weeks Ankle swelling, fluid retention, rapid weight gain more than 5 pounds Low blood pressure and feeling lightheaded or dizzy when standing up quickly Fast or irregular heartbeat Headaches  Recommend taking Heliocare sun protection supplement daily in sunny weather for additional sun protection. For maximum protection on the sunniest days, you can take up to 2 capsules of regular Heliocare OR take 1 capsule of Heliocare Ultra. For prolonged exposure (such as a full day in the sun), you can repeat your dose of the supplement 4 hours after your first dose. Heliocare can be purchased at [employer], at some [facility] or at [email or web address].    Melanoma  ABCDEs  Melanoma is the most dangerous type of skin cancer, and is the leading cause of death from skin disease.  You are more likely to develop melanoma if you: Have light-colored skin, light-colored eyes, or red or blond hair Spend a lot of time in the sun Tan regularly, either outdoors or in a tanning bed Have had blistering sunburns, especially during childhood Have a close family member who has had a melanoma Have atypical moles or large birthmarks  Early detection of melanoma is key since treatment is typically straightforward and cure rates are extremely high if we catch it early.   The first sign of melanoma is often a change in a mole or a new dark spot.  The ABCDE system is a way of remembering the signs of melanoma.  A for asymmetry:  The two halves do not match. B for border:  The edges of the growth are irregular. C for color:  A mixture of colors are present instead of an even brown color. D for diameter:  Melanomas are usually (but not always) greater than 28m - the size of a pencil eraser. E for evolution:  The spot keeps changing in size, shape, and color.  Please check your skin once per month between visits. You can use a small mirror in front and a large mirror behind you to keep an eye on the back side or your body.   If you see any new or changing lesions before your next follow-up, please call to schedule a visit.  Please continue daily skin protection including broad spectrum sunscreen SPF 30+ to sun-exposed areas, reapplying every  2 hours as needed when you're outdoors.    Due to recent changes in healthcare laws, you may see results of your pathology and/or laboratory studies on MyChart before the doctors have had a chance to review them. We understand that in some cases there may be results that are confusing or concerning to you. Please understand that not all results are received at the same time and often the doctors may need to interpret multiple results in  order to provide you with the best plan of care or course of treatment. Therefore, we ask that you please give [place] 2 business days to thoroughly review all your results before contacting the office for clarification. Should we see a critical lab result, you will be contacted sooner.   If You Need Anything After Your Visit  If you have any questions or concerns for your doctor, please call our main line at [phone] and press option 4 to reach your doctor's medical assistant. If no one answers, please leave a voicemail as directed and we will return your call as soon as possible. Messages left after 4 pm will be answered the following business day.   You may also send [place] a message via [place]. We typically respond to MyChart messages within 1-2 business days.  For prescription refills, please ask your pharmacy to contact our office. Our fax number is [phone].  If you have an urgent issue when the clinic is closed that cannot wait until the next business day, you can page your doctor at the number below.    Please note that while we do our best to be available for urgent issues outside of office hours, we are not available 24/7.   If you have an urgent issue and are unable to reach [place], you may choose to seek medical care at your doctor's office, retail clinic, urgent care center, or emergency room.  If you have a medical emergency, please immediately call 911 or go to the emergency department.  Pager Numbers  - Dr. Nehemiah Massed: [phone]  - Dr. Laurence Ferrari: [phone]  - Dr. Nicole Kindred: [phone]  In the event of inclement weather, please call our main line at [phone] for an update on the status of any delays or closures.  Dermatology Medication Tips: Please keep the boxes that topical medications come in in order to help keep track of the instructions about where and how to use these. Pharmacies typically print the medication instructions only on the boxes and not directly on the  medication tubes.   If your medication is too expensive, please contact our office at [phone] option 4 or send [place] a message through [place].   We are unable to tell what your co-pay for medications will be in advance as this is different depending on your insurance coverage. However, we may be able to find a substitute medication at lower cost or fill out paperwork to get insurance to cover a needed medication.   If a prior authorization is required to get your medication covered by your insurance company, please allow [place] 1-2 business days to complete this process.  Drug prices often vary depending on where the prescription is filled and some pharmacies may offer cheaper prices.  The website www.goodrx.com contains coupons for medications through different pharmacies. The prices here do not account for what the cost may be with help from insurance (it may be cheaper with your insurance), but the website can give you the price if you did not use any insurance.  -  You can print the associated coupon and take it with your prescription to the pharmacy.  - You may also stop by our office during regular business hours and pick up a GoodRx coupon card.  - If you need your prescription sent electronically to a different pharmacy, notify our office through [facility] or by phone at [phone] option 4.     Si Usted Necesita Algo Despus de Su Visita  Tambin puede enviarnos un mensaje a travs de Pharmacist, community. Por lo general respondemos a los mensajes de MyChart en el transcurso de 1 a 2 das hbiles.  Para renovar recetas, por favor pida a su farmacia que se ponga en contacto con nuestra oficina. Harland Dingwall de fax es [place] [phone].  Si tiene un asunto urgente cuando la clnica est cerrada y que no puede esperar hasta el siguiente da hbil, puede llamar/localizar a su doctor(a) al nmero que aparece a continuacin.   Por favor, tenga en cuenta que aunque hacemos todo lo posible  para estar disponibles para asuntos urgentes fuera del horario de [place], no estamos disponibles las 24 horas del da, los 7 das de la [place].   Si tiene un problema urgente y no puede comunicarse con nosotros, puede optar por buscar atencin mdica  en el consultorio de su doctor(a), en una clnica privada, en un centro de atencin urgente o en una sala de emergencias.  Si tiene Engineering geologist, por favor llame inmediatamente al 911 o vaya a la sala de emergencias.  Nmeros de bper  - Dr. Nehemiah Massed: [phone]  - Dra. Moye: [phone]  - Dra. Nicole Kindred: [phone]  En caso de inclemencias del [place], por favor llame a Johnsie Kindred principal al [phone] para una actualizacin sobre el [place] de cualquier retraso o cierre.  Consejos para la medicacin en dermatologa: Por favor, guarde las cajas en las que vienen los medicamentos de uso tpico para ayudarle a seguir las instrucciones sobre dnde y cmo usarlos. Las farmacias generalmente imprimen las instrucciones del medicamento slo en las cajas y no directamente en los tubos del [place].   Si su medicamento es muy caro, por favor, pngase en contacto con Zigmund Daniel llamando al [phone] y presione la opcin 4 o envenos un mensaje a travs de Pharmacist, community.   No podemos decirle cul ser su copago por los medicamentos por adelantado ya que esto es diferente dependiendo de la cobertura de su seguro. Sin embargo, es posible que podamos encontrar un medicamento sustituto a Electrical engineer un formulario para que el seguro cubra el medicamento que se considera necesario.   Si se requiere una autorizacin previa para que su compaa de seguros [place] su medicamento, por favor permtanos de 1 a 2 das hbiles para completar este proceso.  Los precios de los medicamentos varan con frecuencia dependiendo del Environmental consultant de dnde se surte la receta y alguna farmacias pueden ofrecer precios ms baratos.  El sitio web  www.goodrx.com tiene cupones para medicamentos de Airline pilot. Los precios aqu no tienen en cuenta lo que podra costar con la ayuda del seguro (puede ser ms barato con su seguro), pero el sitio web puede darle el precio si no utiliz Research scientist (physical sciences).  - Puede imprimir el cupn correspondiente y llevarlo con su receta a la farmacia.  - Tambin puede pasar por nuestra oficina durante el horario de atencin regular y Charity fundraiser una tarjeta de cupones de GoodRx.  - Si necesita que su receta se enve electrnicamente a una farmacia diferente, informe  a nuestra oficina a travs de MyChart de [place] o por telfono llamando al [phone] y presione la opcin 4.

## 2022-08-27 ENCOUNTER — Telehealth: Payer: Self-pay

## 2022-08-27 LAB — VITAMIN D, 25-HYDROXY, TOTAL: Vitamin D, 25-Hydroxy, Serum: 49 ng/mL

## 2022-08-27 LAB — FERRITIN: Ferritin: 15 ng/mL (ref 15–150)

## 2022-08-27 LAB — TSH RFX ON ABNORMAL TO FREE T4: TSH: 1.2 u[IU]/mL (ref 0.450–4.500)

## 2022-08-27 NOTE — Telephone Encounter (Signed)
Discussed lab results. Add Solar Gentle Iron suppliment daily or every other day. Add stool softener if needed. Patient voiced understanding.

## 2022-08-27 NOTE — Telephone Encounter (Signed)
-----   Message from Alfonso Patten, MD sent at 08/27/2022  2:08 PM EST ----- Vitamin D and thyroid testing are fine. Iron stores appear to be low. Recommend Solgar gentle iron every other day (daily is fine if it is hard to remember every other day). This can sometimes cause some constipation or stomach upset, and ok to take a stool softener if needed like docusate (colace).  MAs please call. Thank you!

## 2022-09-06 ENCOUNTER — Other Ambulatory Visit: Payer: Self-pay

## 2022-09-06 DIAGNOSIS — Z1231 Encounter for screening mammogram for malignant neoplasm of breast: Secondary | ICD-10-CM

## 2022-09-17 ENCOUNTER — Ambulatory Visit
Admission: RE | Admit: 2022-09-17 | Discharge: 2022-09-17 | Disposition: A | Discharge status: Home / Self Care | Payer: Managed Care, Other (non HMO) | Source: Ambulatory Visit

## 2022-09-17 DIAGNOSIS — Z1231 Encounter for screening mammogram for malignant neoplasm of breast: Secondary | ICD-10-CM | POA: Insufficient documentation

## 2022-11-07 ENCOUNTER — Other Ambulatory Visit: Payer: Self-pay | Admitting: Dermatology

## 2022-11-07 DIAGNOSIS — L659 Nonscarring hair loss, unspecified: Secondary | ICD-10-CM

## 2023-04-28 ENCOUNTER — Telehealth: Payer: Self-pay

## 2023-04-28 MED ORDER — MINOXIDIL 10 MG PO TABS: 5.0000 mg | ORAL_TABLET | Freq: Every day | ORAL | 3 refills | Status: DC

## 2023-04-28 NOTE — Addendum Note (Signed)
Addended by: Dorathy Daft R on: 04/28/2023 11:46 AM   Modules accepted: Orders

## 2023-04-28 NOTE — Telephone Encounter (Signed)
RF for whole tablet of Minoxidil. aw

## 2023-08-19 ENCOUNTER — Encounter: Payer: Self-pay | Admitting: Dermatology

## 2023-08-19 ENCOUNTER — Ambulatory Visit (INDEPENDENT_AMBULATORY_CARE_PROVIDER_SITE_OTHER): Payer: Managed Care, Other (non HMO) | Admitting: Dermatology

## 2023-08-19 DIAGNOSIS — L814 Other melanin hyperpigmentation: Secondary | ICD-10-CM

## 2023-08-19 DIAGNOSIS — L658 Other specified nonscarring hair loss: Secondary | ICD-10-CM

## 2023-08-19 DIAGNOSIS — D1801 Hemangioma of skin and subcutaneous tissue: Secondary | ICD-10-CM

## 2023-08-19 DIAGNOSIS — Z86018 Personal history of other benign neoplasm: Secondary | ICD-10-CM

## 2023-08-19 DIAGNOSIS — L578 Other skin changes due to chronic exposure to nonionizing radiation: Secondary | ICD-10-CM | POA: Diagnosis not present

## 2023-08-19 DIAGNOSIS — Z79899 Other long term (current) drug therapy: Secondary | ICD-10-CM

## 2023-08-19 DIAGNOSIS — Z1283 Encounter for screening for malignant neoplasm of skin: Secondary | ICD-10-CM

## 2023-08-19 DIAGNOSIS — L649 Androgenic alopecia, unspecified: Secondary | ICD-10-CM

## 2023-08-19 DIAGNOSIS — L821 Other seborrheic keratosis: Secondary | ICD-10-CM

## 2023-08-19 DIAGNOSIS — W908XXA Exposure to other nonionizing radiation, initial encounter: Secondary | ICD-10-CM | POA: Diagnosis not present

## 2023-08-19 DIAGNOSIS — D229 Melanocytic nevi, unspecified: Secondary | ICD-10-CM

## 2023-08-19 MED ORDER — MINOXIDIL 10 MG PO TABS: 5.0000 mg | ORAL_TABLET | Freq: Every day | ORAL | 3 refills | Status: DC

## 2023-08-19 NOTE — Patient Instructions (Addendum)
Continue Minoxidil 10 mg 1/2 tablet daily.    Recommend daily broad spectrum sunscreen SPF 30+ to sun-exposed areas, reapply every 2 hours as needed. Call for new or changing lesions.  Staying in the shade or wearing long sleeves, sun glasses (UVA+UVB protection) and wide brim hats (4-inch brim around the entire circumference of the hat) are also recommended for sun protection.      Melanoma ABCDEs  Melanoma is the most dangerous type of skin cancer, and is the leading cause of death from skin disease.  You are more likely to develop melanoma if you: Have light-colored skin, light-colored eyes, or red or blond hair Spend a lot of time in the sun Tan regularly, either outdoors or in a tanning bed Have had blistering sunburns, especially during childhood Have a close family member who has had a melanoma Have atypical moles or large birthmarks  Early detection of melanoma is key since treatment is typically straightforward and cure rates are extremely high if we catch it early.   The first sign of melanoma is often a change in a mole or a new dark spot.  The ABCDE system is a way of remembering the signs of melanoma.  A for asymmetry:  The two halves do not match. B for border:  The edges of the growth are irregular. C for color:  A mixture of colors are present instead of an even brown color. D for diameter:  Melanomas are usually (but not always) greater than 6mm - the size of a pencil eraser. E for evolution:  The spot keeps changing in size, shape, and color.  Please check your skin once per month between visits. You can use a small mirror in front and a large mirror behind you to keep an eye on the back side or your body.   If you see any new or changing lesions before your next follow-up, please call to schedule a visit.  Please continue daily skin protection including broad spectrum sunscreen SPF 30+ to sun-exposed areas, reapplying every 2 hours as needed when you're outdoors.    Staying in the shade or wearing long sleeves, sun glasses (UVA+UVB protection) and wide brim hats (4-inch brim around the entire circumference of the hat) are also recommended for sun protection.      Due to recent changes in healthcare laws, you may see results of your pathology and/or laboratory studies on MyChart before the doctors have had a chance to review them. We understand that in some cases there may be results that are confusing or concerning to you. Please understand that not all results are received at the same time and often the doctors may need to interpret multiple results in order to provide you with the best plan of care or course of treatment. Therefore, we ask that you please give [place] 2 business days to thoroughly review all your results before contacting the office for clarification. Should we see a critical lab result, you will be contacted sooner.   If You Need Anything After Your Visit  If you have any questions or concerns for your doctor, please call our main line at [phone] and press option 4 to reach your doctor's medical assistant. If no one answers, please leave a voicemail as directed and we will return your call as soon as possible. Messages left after 4 pm will be answered the following business day.   You may also send [place] a message via MyChart. We typically respond to MyChart messages within  1-2 business days.  For prescription refills, please ask your pharmacy to contact our office. Our fax number is [phone].  If you have an urgent issue when the clinic is closed that cannot wait until the next business day, you can page your doctor at the number below.    Please note that while we do our best to be available for urgent issues outside of office hours, we are not available 24/7.   If you have an urgent issue and are unable to reach [place], you may choose to seek medical care at your doctor's office, retail clinic, urgent care center, or emergency  room.  If you have a medical emergency, please immediately call 911 or go to the emergency department.  Pager Numbers  - Dr. Gwen Pounds: [phone]  - Dr. Roseanne Reno: [phone]  - Dr. Katrinka Blazing: [phone]   In the event of inclement weather, please call our main line at [phone] for an update on the status of any delays or closures.  Dermatology Medication Tips: Please keep the boxes that topical medications come in in order to help keep track of the instructions about where and how to use these. Pharmacies typically print the medication instructions only on the boxes and not directly on the medication tubes.   If your medication is too expensive, please contact our office at [phone] option 4 or send [place] a message through MyChart.   We are unable to tell what your co-pay for medications will be in advance as this is different depending on your insurance coverage. However, we may be able to find a substitute medication at lower cost or fill out paperwork to get insurance to cover a needed medication.   If a prior authorization is required to get your medication covered by your insurance company, please allow [place] 1-2 business days to complete this process.  Drug prices often vary depending on where the prescription is filled and some pharmacies may offer cheaper prices.  The website www.goodrx.com contains coupons for medications through different pharmacies. The prices here do not account for what the cost may be with help from insurance (it may be cheaper with your insurance), but the website can give you the price if you did not use any insurance.  - You can print the associated coupon and take it with your prescription to the pharmacy.  - You may also stop by our office during regular business hours and pick up a GoodRx coupon card.  - If you need your prescription sent electronically to a different pharmacy, notify our office through [facility] or by phone at  [phone] option 4.     Si Usted Necesita Algo Despus de Su Visita  Tambin puede enviarnos un mensaje a travs de Clinical cytogeneticist. Por lo general respondemos a los mensajes de MyChart en el transcurso de 1 a 2 das hbiles.  Para renovar recetas, por favor pida a su farmacia que se ponga en contacto con nuestra oficina. Annie Sable de fax es [place] [phone].  Si tiene un asunto urgente cuando la clnica est cerrada y que no puede esperar hasta el siguiente da hbil, puede llamar/localizar a su doctor(a) al nmero que aparece a continuacin.   Por favor, tenga en cuenta que aunque hacemos todo lo posible para estar disponibles para asuntos urgentes fuera del horario de [place], no estamos disponibles las 24 horas del da, los 7 [address]  [address] de la [place].   Si tiene un problema urgente y no puede comunicarse con nosotros, puede optar por  buscar atencin mdica  en el consultorio de su doctor(a), en una clnica privada, en un centro de atencin urgente o en una sala de emergencias.  Si tiene Engineer, drilling, por favor llame inmediatamente al 911 o vaya a la sala de emergencias.  Nmeros de bper  - Dr. Gwen Pounds: [phone]  - Dra. Roseanne Reno: [phone]  - Dr. Katrinka Blazing: [phone]   En caso de inclemencias del tiempo, por favor llame a Lacy Duverney principal al [phone] para una actualizacin sobre el [place] de cualquier retraso o cierre.  Consejos para la medicacin en dermatologa: Por favor, guarde las cajas en las que vienen los medicamentos de uso tpico para ayudarle a seguir las instrucciones sobre dnde y cmo usarlos. Las farmacias generalmente imprimen las instrucciones del medicamento slo en las cajas y no directamente en los tubos del [place].   Si su medicamento es muy caro, por favor, pngase en contacto con Rolm Gala llamando al [phone] y presione la opcin 4 o envenos un mensaje a travs de Clinical cytogeneticist.   No podemos decirle cul ser su copago por los  medicamentos por adelantado ya que esto es diferente dependiendo de la cobertura de su seguro. Sin embargo, es posible que podamos encontrar un medicamento sustituto a Audiological scientist un formulario para que el seguro cubra el medicamento que se considera necesario.   Si se requiere una autorizacin previa para que su compaa de seguros [place] su medicamento, por favor permtanos de 1 a 2 das hbiles para completar [address]  Los precios de los medicamentos varan con frecuencia dependiendo del Environmental consultant de dnde se surte la receta y alguna farmacias pueden ofrecer precios ms baratos.  El sitio web www.goodrx.com tiene cupones para medicamentos de Health and safety inspector. Los precios aqu no tienen en cuenta lo que podra costar con la ayuda del seguro (puede ser ms barato con su seguro), pero el sitio web puede darle el precio si no utiliz Tourist information centre manager.  - Puede imprimir el cupn correspondiente y llevarlo con su receta a la farmacia.  - Tambin puede pasar por nuestra oficina durante el horario de atencin regular y Education officer, museum una tarjeta de cupones de GoodRx.  - Si necesita que su receta se enve electrnicamente a una farmacia diferente, informe a nuestra oficina a travs de MyChart de [place] o por telfono llamando al [phone] y presione la opcin 4.

## 2023-08-19 NOTE — Progress Notes (Signed)
Follow-Up Visit   Subjective  Tracy Cherry is a 43 y.o. female who presents for the following: Skin Cancer Screening and Full Body Skin Exam. Hx of dysplastic nevus.   The patient presents for Total-Body Skin Exam (TBSE) for skin cancer screening and mole check. The patient has spots, moles and lesions to be evaluated, some may be new or changing and the patient may have concern these could be cancer.  Hx of alopecia. Takes Minoxidil 10 mg 1/2 tablet daily and iron supplement. Tolerating well. No adverse reactions to Minoxidil including edema or tachycardia. Thinks is helping. Hair dresser has noticed a lot of new growth.    The following portions of the chart were reviewed this encounter and updated as appropriate: medications, allergies, medical history  Review of Systems:  No other skin or systemic complaints except as noted in HPI or Assessment and Plan.  Objective  Well appearing patient in no apparent distress; mood and affect are within normal limits.  A full examination was performed including scalp, head, eyes, ears, nose, lips, neck, chest, axillae, abdomen, back, buttocks, bilateral upper extremities, bilateral lower extremities, hands, feet, fingers, toes, fingernails, and toenails. All findings within normal limits unless otherwise noted below.   Relevant physical exam findings are noted in the Assessment and Plan. Exam of face limited by presence of make-up.    Assessment & Plan   HISTORY OF DYSPLASTIC NEVUS. Right distal lateral calf. 10/17/2014. No evidence of recurrence today Recommend regular full body skin exams Recommend daily broad spectrum sunscreen SPF 30+ to sun-exposed areas, reapply every 2 hours as needed.  Call if any new or changing lesions are noted between office visits   SKIN CANCER SCREENING PERFORMED TODAY.  ACTINIC DAMAGE - Chronic condition, secondary to cumulative UV/sun exposure - diffuse scaly erythematous macules with underlying  dyspigmentation - Recommend daily broad spectrum sunscreen SPF 30+ to sun-exposed areas, reapply every 2 hours as needed.  - Staying in the shade or wearing long sleeves, sun glasses (UVA+UVB protection) and wide brim hats (4-inch brim around the entire circumference of the hat) are also recommended for sun protection.  - Call for new or changing lesions.  LENTIGINES, SEBORRHEIC KERATOSES, HEMANGIOMAS - Benign normal skin lesions - Benign-appearing - Call for any changes  MELANOCYTIC NEVI - Tan-brown and/or pink-flesh-colored symmetric macules and papules - Benign appearing on exam today - Observation - Call clinic for new or changing moles - Recommend daily use of broad spectrum spf 30+ sunscreen to sun-exposed areas.   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 Well controlled.   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  In women androgenetic alopecia is commonly associated with menopause but may occur any time after puberty.  It causes hair thinning primarily on the crown with widening of the part and temporal hairline recession.  Can use OTC Rogaine (minoxidil) 5% solution/foam as directed.  Oral treatments in female patients who have no contraindication may include : - Low dose oral minoxidil 1.25 - 5mg  daily - Spironolactone 50 - 100mg  bid - Finasteride 2.5 - 5 mg daily Adjunctive therapies include: - Low Level Laser Light Therapy (LLLT) - Platelet-rich plasma injections (PRP) - Hair Transplants or scalp reduction   Treatment Plan: Continue Minoxidil 10 mg 1/2 tablet daily. No side effects. Discussed this is a higher dose than  the typical max for women   Long term medication management.  Patient is using long term (months to years) prescription medication  to control their dermatologic  condition.  These medications require periodic monitoring to evaluate for efficacy and side effects and may require periodic laboratory monitoring.       Return in about 1 year (around 08/18/2024) for TBSE, HxDN.  I, Lawson Radar, CMA, am acting as scribe for Elie Goody, MD.   Documentation: I have reviewed the above documentation for accuracy and completeness, and I agree with the above.  Elie Goody, MD

## 2023-08-21 ENCOUNTER — Encounter: Payer: Managed Care, Other (non HMO) | Admitting: Dermatology

## 2023-09-10 ENCOUNTER — Other Ambulatory Visit: Payer: Self-pay

## 2023-09-10 DIAGNOSIS — Z1231 Encounter for screening mammogram for malignant neoplasm of breast: Secondary | ICD-10-CM

## 2023-09-26 ENCOUNTER — Ambulatory Visit
Admission: RE | Admit: 2023-09-26 | Discharge: 2023-09-26 | Disposition: A | Discharge status: Home / Self Care | Payer: Managed Care, Other (non HMO) | Source: Ambulatory Visit

## 2023-09-26 DIAGNOSIS — Z1231 Encounter for screening mammogram for malignant neoplasm of breast: Secondary | ICD-10-CM | POA: Diagnosis present

## 2024-01-14 ENCOUNTER — Ambulatory Visit: Payer: Self-pay

## 2024-01-14 DIAGNOSIS — K635 Polyp of colon: Secondary | ICD-10-CM | POA: Diagnosis not present

## 2024-01-14 DIAGNOSIS — Z1211 Encounter for screening for malignant neoplasm of colon: Secondary | ICD-10-CM | POA: Diagnosis present

## 2024-08-19 ENCOUNTER — Ambulatory Visit: Payer: Managed Care, Other (non HMO) | Admitting: Dermatology

## 2024-08-19 ENCOUNTER — Encounter: Payer: Self-pay | Admitting: Dermatology

## 2024-08-19 DIAGNOSIS — L658 Other specified nonscarring hair loss: Secondary | ICD-10-CM | POA: Diagnosis not present

## 2024-08-19 DIAGNOSIS — L814 Other melanin hyperpigmentation: Secondary | ICD-10-CM

## 2024-08-19 DIAGNOSIS — Z1283 Encounter for screening for malignant neoplasm of skin: Secondary | ICD-10-CM | POA: Diagnosis not present

## 2024-08-19 DIAGNOSIS — D229 Melanocytic nevi, unspecified: Secondary | ICD-10-CM

## 2024-08-19 DIAGNOSIS — L821 Other seborrheic keratosis: Secondary | ICD-10-CM

## 2024-08-19 DIAGNOSIS — W908XXA Exposure to other nonionizing radiation, initial encounter: Secondary | ICD-10-CM

## 2024-08-19 DIAGNOSIS — L578 Other skin changes due to chronic exposure to nonionizing radiation: Secondary | ICD-10-CM

## 2024-08-19 DIAGNOSIS — Z86018 Personal history of other benign neoplasm: Secondary | ICD-10-CM | POA: Diagnosis not present

## 2024-08-19 DIAGNOSIS — Z79899 Other long term (current) drug therapy: Secondary | ICD-10-CM

## 2024-08-19 DIAGNOSIS — D1801 Hemangioma of skin and subcutaneous tissue: Secondary | ICD-10-CM | POA: Diagnosis not present

## 2024-08-19 DIAGNOSIS — I781 Nevus, non-neoplastic: Secondary | ICD-10-CM | POA: Diagnosis not present

## 2024-08-19 NOTE — Patient Instructions (Addendum)
 Basic OTC daily skin care regimen to prevent photoaging:   Recommend facial moisturizer with sunscreen SPF 30 every morning (OTC brands include CeraVe AM, Neutrogena, Eucerin, Cetaphil, Aveeno, La Roche Posay).  Can also apply a topical Vit C serum which is an antioxidant (OTC brands include CeraVe, La Roche Posay, Neutrogena and The Ordinary) underneath sunscreen in morning. If you are outside during the day in the summer for extended periods, especially swimming and/or sweating, make sure you apply a water resistant facial sunscreen lotion spf 30 or higher.   At night recommend a cream with retinol (a vitamin A derivative which stimulates collagen production) like CeraVe skin renewing retinol serum or ROC retinol correxion cream or Neutrogena rapid wrinkle repair cream. Retinol may cause skin irritation in people with sensitive skin.  Can use it every other day and/or apply on top of a hyaluronic acid (HA) moisturizer/serum (Neutrogena Hydroboost water cream) if better tolerated that way.  Retinol may also help with lightening brown spots.   Our office sells high quality, medically tested skin care lines such as Elta MD sunscreens (with Zinc), and Alastin skin care products, which are very effective in treating photoaging. The Alastin line includes cosmeceutical grade Vit.C serum, HA serum, Elastin stimulating moisturizers/serums, lightening serum, and sunscreens.  If you want prescription treatment, then you would need an appointment (Rx tretinoin and fade creams, Botox, filler injections, laser treatments, etc.) These prescriptions and procedures are not covered by insurance but work very well.    Recommend daily broad spectrum sunscreen SPF 30+ to sun-exposed areas, reapply every 2 hours as needed. Call for new or changing lesions.  Staying in the shade or wearing long sleeves, sun glasses (UVA+UVB protection) and wide brim hats (4-inch brim around the entire circumference of the hat) are also  recommended for sun protection.      Melanoma ABCDEs  Melanoma is the most dangerous type of skin cancer, and is the leading cause of death from skin disease.  You are more likely to develop melanoma if you: Have light-colored skin, light-colored eyes, or red or blond hair Spend a lot of time in the sun Tan regularly, either outdoors or in a tanning bed Have had blistering sunburns, especially during childhood Have a close family member who has had a melanoma Have atypical moles or large birthmarks  Early detection of melanoma is key since treatment is typically straightforward and cure rates are extremely high if we catch it early.   The first sign of melanoma is often a change in a mole or a new dark spot.  The ABCDE system is a way of remembering the signs of melanoma.  A for asymmetry:  The two halves do not match. B for border:  The edges of the growth are irregular. C for color:  A mixture of colors are present instead of an even brown color. D for diameter:  Melanomas are usually (but not always) greater than 6mm - the size of a pencil eraser. E for evolution:  The spot keeps changing in size, shape, and color.  Please check your skin once per month between visits. You can use a small mirror in front and a large mirror behind you to keep an eye on the back side or your body.   If you see any new or changing lesions before your next follow-up, please call to schedule a visit.  Please continue daily skin protection including broad spectrum sunscreen SPF 30+ to sun-exposed areas, reapplying every 2 hours as  needed when you're outdoors.   Staying in the shade or wearing long sleeves, sun glasses (UVA+UVB protection) and wide brim hats (4-inch brim around the entire circumference of the hat) are also recommended for sun protection.      Due to recent changes in healthcare laws, you may see results of your pathology and/or laboratory studies on MyChart before the doctors have had  a chance to review them. We understand that in some cases there may be results that are confusing or concerning to you. Please understand that not all results are received at the same time and often the doctors may need to interpret multiple results in order to provide you with the best plan of care or course of treatment. Therefore, we ask that you please give us  2 business days to thoroughly review all your results before contacting the office for clarification. Should we see a critical lab result, you will be contacted sooner.   If You Need Anything After Your Visit  If you have any questions or concerns for your doctor, please call our main line at [phone] and press option 4 to reach your doctor's medical assistant. If no one answers, please leave a voicemail as directed and we will return your call as soon as possible. Messages left after 4 pm will be answered the following business day.   You may also send us  a message via MyChart. We typically respond to MyChart messages within 1-2 business days.  For prescription refills, please ask your pharmacy to contact our office. Our fax number is [phone].  If you have an urgent issue when the clinic is closed that cannot wait until the next business day, you can page your doctor at the number below.    Please note that while we do our best to be available for urgent issues outside of office hours, we are not available 24/7.   If you have an urgent issue and are unable to reach us , you may choose to seek medical care at your doctor's office, retail clinic, urgent care center, or emergency room.  If you have a medical emergency, please immediately call 911 or go to the emergency department.  Pager Numbers  - Dr. Hester: [phone]  - Dr. Jackquline: [phone]  - Dr. Claudene: [phone]   - Dr. Raymund: [phone]  In the event of inclement weather, please call our main line at [phone] for an update on the status of any  delays or closures.  Dermatology Medication Tips: Please keep the boxes that topical medications come in in order to help keep track of the instructions about where and how to use these. Pharmacies typically print the medication instructions only on the boxes and not directly on the medication tubes.   If your medication is too expensive, please contact our office at [phone] option 4 or send us  a message through MyChart.   We are unable to tell what your co-pay for medications will be in advance as this is different depending on your insurance coverage. However, we may be able to find a substitute medication at lower cost or fill out paperwork to get insurance to cover a needed medication.   If a prior authorization is required to get your medication covered by your insurance company, please allow us  1-2 business days to complete this process.  Drug prices often vary depending on where the prescription is filled and some pharmacies may offer cheaper prices.  The website www.goodrx.com contains coupons for medications through different pharmacies. The  prices here do not account for what the cost may be with help from insurance (it may be cheaper with your insurance), but the website can give you the price if you did not use any insurance.  - You can print the associated coupon and take it with your prescription to the pharmacy.  - You may also stop by our office during regular business hours and pick up a GoodRx coupon card.  - If you need your prescription sent electronically to a different pharmacy, notify our office through [facility] or by phone at [phone] option 4.     Si Usted Necesita Algo Despus de Su Visita  Tambin puede enviarnos un mensaje a travs de Clinical Cytogeneticist. Por lo general respondemos a los mensajes de MyChart en el transcurso de 1 a 2 das hbiles.  Para renovar recetas, por favor pida a su farmacia que se ponga en contacto con nuestra oficina. Randi lakes de fax es [place] [phone].  Si tiene un asunto urgente cuando la clnica est cerrada y que no puede esperar hasta el siguiente da hbil, puede llamar/localizar a su doctor(a) al nmero que aparece a continuacin.   Por favor, tenga en cuenta que aunque hacemos todo lo posible para estar disponibles para asuntos urgentes fuera del horario de [place], no estamos disponibles las 24 horas del da, los 7 [address]  [phone] de la [place].   Si tiene un problema urgente y no puede comunicarse con nosotros, puede optar por buscar atencin mdica  en el consultorio de su doctor(a), en una clnica privada, en un centro de atencin urgente o en una sala de emergencias.  Si tiene engineer, drilling, por favor llame inmediatamente al 911 o vaya a la sala de emergencias.  Nmeros de bper  - Dr. Hester: [phone]  - Dra. Jackquline: [phone]  - Dr. Claudene: [phone]  - Dra. Kitts: [phone]  En caso de inclemencias del [place], por favor llame a nuestra lnea principal al [phone] para una actualizacin sobre el estado de cualquier retraso o cierre.  Consejos para la medicacin en dermatologa: Por favor, guarde las cajas en las que vienen los medicamentos de uso tpico para ayudarle a seguir las instrucciones sobre dnde y cmo usarlos. Las farmacias generalmente imprimen las instrucciones del medicamento slo en las cajas y no directamente en los tubos del [place].   Si su medicamento es muy caro, por favor, pngase en contacto con landry rieger llamando al [phone] y presione la opcin 4 o envenos un mensaje a travs de Clinical Cytogeneticist.   No podemos decirle cul ser su copago por los medicamentos por adelantado ya que esto es diferente dependiendo de la cobertura de su seguro. Sin embargo, es posible que podamos encontrar un medicamento sustituto a audiological scientist un formulario para que el seguro cubra el medicamento que se considera necesario.   Si se requiere una autorizacin  previa para que su compaa de seguros cubra su medicamento, por favor permtanos de 1 a 2 das hbiles para completar este proceso.  Los precios de los medicamentos varan con frecuencia dependiendo del environmental consultant de dnde se surte la receta y alguna farmacias pueden ofrecer precios ms baratos.  El sitio web www.goodrx.com tiene cupones para medicamentos de health and safety inspector. Los precios aqu no tienen en cuenta lo que podra costar con la ayuda del seguro (puede ser ms barato con su seguro), pero el sitio web puede darle el precio si no utiliz tourist information centre manager.  - Puede imprimir el cupn correspondiente y  llevarlo con su receta a la farmacia.  - Tambin puede pasar por nuestra oficina durante el horario de atencin regular y education officer, museum una tarjeta de cupones de GoodRx.  - Si necesita que su receta se enve electrnicamente a una farmacia diferente, informe a nuestra oficina a travs de MyChart de [place] o por telfono llamando al [phone] y presione la opcin 4.

## 2024-08-19 NOTE — Progress Notes (Signed)
 "  Follow-Up Visit   Subjective  AAVA DELAND is a 44 y.o. female who presents for the following: Skin Cancer Screening and Full Body Skin Exam. Hx of dysplastic nevus.  The patient presents for Total-Body Skin Exam (TBSE) for skin cancer screening and mole check. The patient has spots, moles and lesions to be evaluated, some may be new or changing and the patient may have concern these could be cancer.    The following portions of the chart were reviewed this encounter and updated as appropriate: medications, allergies, medical history  Review of Systems:  No other skin or systemic complaints except as noted in HPI or Assessment and Plan.  Objective  Well appearing patient in no apparent distress; mood and affect are within normal limits.  A full examination was performed including scalp, head, eyes, ears, nose, lips, neck, chest, axillae, abdomen, back, buttocks, bilateral upper extremities, bilateral lower extremities, hands, feet, fingers, toes, fingernails, and toenails. All findings within normal limits unless otherwise noted below.   Relevant physical exam findings are noted in the Assessment and Plan.  Exam of nails limited by presence of nail polish.    Assessment & Plan   SKIN CANCER SCREENING PERFORMED TODAY.   HISTORY OF DYSPLASTIC NEVUS. Right distal lateral calf. 10/17/2014. No evidence of recurrence today Recommend regular full body skin exams Recommend daily broad spectrum sunscreen SPF 30+ to sun-exposed areas, reapply every 2 hours as needed.  Call if any new or changing lesions are noted between office visits    ACTINIC DAMAGE - Chronic condition, secondary to cumulative UV/sun exposure - diffuse scaly erythematous macules with underlying dyspigmentation - Recommend daily broad spectrum sunscreen SPF 30+ to sun-exposed areas, reapply every 2 hours as needed.  - Staying in the shade or wearing long sleeves, sun glasses (UVA+UVB protection) and wide brim  hats (4-inch brim around the entire circumference of the hat) are also recommended for sun protection.  - Call for new or changing lesions. - can start adapalene 0.1% gel daily  LENTIGINES, SEBORRHEIC KERATOSES, HEMANGIOMAS - Benign normal skin lesions - Benign-appearing - Call for any changes  MELANOCYTIC NEVI - Tan-brown and/or pink-flesh-colored symmetric macules and papules - Benign appearing on exam today - Observation - Call clinic for new or changing moles - Recommend daily use of broad spectrum spf 30+ sunscreen to sun-exposed areas.  - Check nails when remove polish.   Spider Veins - Dilated blue, purple or red veins at the lower extremities - Reassured - Smaller vessels can be treated by sclerotherapy (a procedure to inject a medicine into the veins to make them disappear) if desired, but the treatment is not covered by insurance. Larger vessels may be covered if symptomatic and we would refer to vascular surgeon if treatment desired.  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  In women androgenetic alopecia is commonly associated with menopause but may occur any time after puberty.  It causes hair thinning primarily on the crown with widening of the part and temporal hairline recession.  Can use OTC Rogaine  (minoxidil ) 5% solution/foam as directed.  Oral treatments in female patients who have no contraindication may include : - Low dose oral minoxidil  1.25 - 5mg  daily - Spironolactone 50 - 100mg  bid - Finasteride 2.5 - 5 mg daily Adjunctive therapies include: - Low Level  Laser Light Therapy (LLLT) - Platelet-rich plasma injections (PRP) - Hair Transplants or scalp reduction   Treatment Plan: Patient stopped oral minoxidil , was not effective   Long term medication management.  Patient  is using long term (months to years) prescription medication  to control their dermatologic condition.  These medications require periodic monitoring to evaluate for efficacy and side effects and may require periodic laboratory monitoring.  MULTIPLE BENIGN NEVI   LENTIGINES   ACTINIC ELASTOSIS   SEBORRHEIC KERATOSES   CHERRY ANGIOMA   FEMALE PATTERN HAIR LOSS   SPIDER VEINS   Return in about 1 year (around 08/19/2025) for TBSE, HxDN.  I, Jill Parcell, CMA, am acting as scribe for Boneta Sharps, MD.   Documentation: I have reviewed the above documentation for accuracy and completeness, and I agree with the above.  Boneta Sharps, MD    "

## 2025-08-22 ENCOUNTER — Ambulatory Visit: Admitting: Dermatology
